# Patient Record
Sex: Male | Born: 1965 | Race: White | Hispanic: No | Marital: Married | Smoking: Never smoker
Health system: Southern US, Community
[De-identification: ages and names within clinical notes are randomized; demographics above are authoritative.]

## PROBLEM LIST (undated history)

## (undated) DIAGNOSIS — C9590 Leukemia, unspecified not having achieved remission: Secondary | ICD-10-CM

## (undated) DIAGNOSIS — C801 Malignant (primary) neoplasm, unspecified: Secondary | ICD-10-CM

## (undated) DIAGNOSIS — I639 Cerebral infarction, unspecified: Secondary | ICD-10-CM

## (undated) HISTORY — PX: APPENDECTOMY: SHX54

---

## 2019-04-01 DIAGNOSIS — Z1211 Encounter for screening for malignant neoplasm of colon: Secondary | ICD-10-CM | POA: Diagnosis not present

## 2019-04-01 DIAGNOSIS — F329 Major depressive disorder, single episode, unspecified: Secondary | ICD-10-CM | POA: Diagnosis not present

## 2019-04-01 DIAGNOSIS — K640 First degree hemorrhoids: Secondary | ICD-10-CM | POA: Diagnosis not present

## 2019-04-01 DIAGNOSIS — R42 Dizziness and giddiness: Secondary | ICD-10-CM | POA: Diagnosis not present

## 2019-05-06 DIAGNOSIS — R42 Dizziness and giddiness: Secondary | ICD-10-CM | POA: Diagnosis not present

## 2019-05-06 DIAGNOSIS — K640 First degree hemorrhoids: Secondary | ICD-10-CM | POA: Diagnosis not present

## 2019-05-06 DIAGNOSIS — F329 Major depressive disorder, single episode, unspecified: Secondary | ICD-10-CM | POA: Diagnosis not present

## 2019-07-20 DIAGNOSIS — M7710 Lateral epicondylitis, unspecified elbow: Secondary | ICD-10-CM | POA: Diagnosis not present

## 2019-07-20 DIAGNOSIS — K640 First degree hemorrhoids: Secondary | ICD-10-CM | POA: Diagnosis not present

## 2019-07-30 DIAGNOSIS — Z23 Encounter for immunization: Secondary | ICD-10-CM | POA: Diagnosis not present

## 2019-07-30 DIAGNOSIS — S61219A Laceration without foreign body of unspecified finger without damage to nail, initial encounter: Secondary | ICD-10-CM | POA: Diagnosis not present

## 2019-07-30 DIAGNOSIS — S60511A Abrasion of right hand, initial encounter: Secondary | ICD-10-CM | POA: Diagnosis not present

## 2019-10-20 DIAGNOSIS — Z1159 Encounter for screening for other viral diseases: Secondary | ICD-10-CM | POA: Diagnosis not present

## 2019-11-01 DIAGNOSIS — Z1211 Encounter for screening for malignant neoplasm of colon: Secondary | ICD-10-CM | POA: Diagnosis not present

## 2019-11-01 DIAGNOSIS — Z1212 Encounter for screening for malignant neoplasm of rectum: Secondary | ICD-10-CM | POA: Diagnosis not present

## 2019-11-01 DIAGNOSIS — L918 Other hypertrophic disorders of the skin: Secondary | ICD-10-CM | POA: Diagnosis not present

## 2019-11-01 DIAGNOSIS — K641 Second degree hemorrhoids: Secondary | ICD-10-CM | POA: Diagnosis not present

## 2019-11-03 ENCOUNTER — Emergency Department (HOSPITAL_COMMUNITY)
Admission: EM | Admit: 2019-11-03 | Discharge: 2019-11-03 | Disposition: A | Discharge status: Home / Self Care | Payer: BC Managed Care – PPO | Attending: Emergency Medicine | Admitting: Emergency Medicine

## 2019-11-03 ENCOUNTER — Encounter (HOSPITAL_COMMUNITY): Payer: Self-pay | Admitting: *Deleted

## 2019-11-03 ENCOUNTER — Other Ambulatory Visit: Payer: Self-pay

## 2019-11-03 DIAGNOSIS — H3589 Other specified retinal disorders: Secondary | ICD-10-CM | POA: Diagnosis not present

## 2019-11-03 DIAGNOSIS — R519 Headache, unspecified: Secondary | ICD-10-CM | POA: Diagnosis not present

## 2019-11-03 DIAGNOSIS — I10 Essential (primary) hypertension: Secondary | ICD-10-CM | POA: Diagnosis not present

## 2019-11-03 DIAGNOSIS — H539 Unspecified visual disturbance: Secondary | ICD-10-CM | POA: Insufficient documentation

## 2019-11-03 DIAGNOSIS — G453 Amaurosis fugax: Secondary | ICD-10-CM | POA: Diagnosis not present

## 2019-11-03 DIAGNOSIS — H35371 Puckering of macula, right eye: Secondary | ICD-10-CM | POA: Diagnosis not present

## 2019-11-03 DIAGNOSIS — H53411 Scotoma involving central area, right eye: Secondary | ICD-10-CM | POA: Diagnosis not present

## 2019-11-03 HISTORY — DX: Malignant (primary) neoplasm, unspecified: C80.1

## 2019-11-03 NOTE — ED Provider Notes (Signed)
[facility] DEPT Provider Note   CSN: [id number] Arrival date & time: 11/03/19  1140     History Chief Complaint  Patient presents with  . Hypertension  . Headache  . Memory Loss    Julus Kelley is a 54 y.o. 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  Patient states that he went to eye doctor, Dr. Posey Pronto [facility] this morning, was told to go to PCP for outpatient stroke work-up.  Patient went to PCP this morning who referred to ER for emergent stroke work-up.  Patient states that he started having vision loss in his right eye 3 weeks ago, lasted about 10 minutes.  This was his only episode of acute vision loss.  States for the next 5 days he had a sensation that there were "bright lights dropping" bilaterally that also lasted 10 minutes.  States that on the sixth day up until now he has not had any vision changes or problems.  Currently not complaining of any vision problems.  States that at around the same time he had a headache, states that it is frontal and mild.  Not the worst headache of his life. .  Not positional or exertional.  No photophobia, nausea, vomiting, fevers, chills, new neck pain, back pain.  No IV drug use or alcohol use.  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  Has been taking Motrin for his headache which has been helping.  Denies any numbness, tingling, weakness, facial droop, slurring, difficulty speaking during these admission symptoms or currently.  States that he generally healthy.  Has been eating and drinking normally.  Not on any blood thinners.  Also mentions that blood pressure has been raised, states that systolics have been in the 150s, this is new for him states that systolics are  normally in the 120s and is concerned about this. Does not take any medications daily. Marland Kitchen  HPI     Past Medical History:  Diagnosis Date  . Cancer ([place])     There are no problems to display for this patient.   Past Surgical History:  Procedure Laterality Date  . APPENDECTOMY         No family history on file.  Social History   Tobacco Use  . Smoking status: Never Smoker  . Smokeless tobacco: Never Used  Substance Use Topics  . Alcohol use: Yes    Comment: rarely  . Drug use: Never    Home Medications Prior to Admission medications   Not on File    Allergies    Patient has no known allergies.  Review of Systems   Review of Systems  Constitutional: Negative for chills, diaphoresis, fatigue and fever.  HENT: Negative for congestion, sore throat and trouble swallowing.   Eyes: Positive for visual disturbance. Negative for photophobia, pain, discharge, redness and itching.  Respiratory: Negative for cough, shortness of breath, wheezing and stridor.   Cardiovascular: Negative for chest pain, palpitations and leg swelling.  Gastrointestinal: Negative for abdominal distention, abdominal pain, diarrhea, nausea and vomiting.  Genitourinary: Negative for difficulty urinating.  Musculoskeletal: Negative for back pain, neck pain and neck stiffness.  Skin: Negative for pallor.  Neurological: Positive for headaches. Negative for dizziness, tremors, seizures, syncope, facial asymmetry,  speech difficulty, weakness, light-headedness and numbness.  Psychiatric/Behavioral: Negative for confusion.    Physical Exam Updated Vital Signs BP (!) 148/96 (BP Location: Left Arm)   Pulse (!) 53   Temp 98.6 F (37 C) (Oral)   Resp 16   Ht 5\' 10"  (1.778 m)   Wt 101.2 kg   SpO2 97%   BMI 32.00 kg/m   Physical Exam Constitutional:      General: He is not in acute distress.    Appearance: Normal appearance. He is not ill-appearing, toxic-appearing or diaphoretic.  HENT:      Mouth/Throat:     Mouth: Mucous membranes are moist.     Pharynx: Oropharynx is clear.  Eyes:     General: Lids are normal. Vision grossly intact. No visual field deficit or scleral icterus.       Right eye: No foreign body or discharge.        Left eye: No foreign body or discharge.     Extraocular Movements: Extraocular movements intact.     Right eye: Normal extraocular motion and no nystagmus.     Left eye: No nystagmus.     Pupils: Pupils are equal, round, and reactive to light.     Comments: Eyes are both dilated due to patient coming back from ophthalmologist, will reassess.  Cardiovascular:     Rate and Rhythm: Normal rate and regular rhythm.     Pulses: Normal pulses.     Heart sounds: Normal heart sounds.  Pulmonary:     Effort: Pulmonary effort is normal. No respiratory distress.     Breath sounds: Normal breath sounds. No stridor. No wheezing, rhonchi or rales.  Chest:     Chest wall: No tenderness.  Abdominal:     General: Abdomen is flat. There is no distension.     Palpations: Abdomen is soft.     Tenderness: There is no abdominal tenderness. There is no guarding or rebound.  Musculoskeletal:        General: No swelling or tenderness. Normal range of motion.     Cervical back: Normal range of motion and neck supple. No rigidity.     Right lower leg: No edema.     Left lower leg: No edema.  Skin:    General: Skin is warm and dry.     Capillary Refill: Capillary refill takes less than 2 seconds.     Coloration: Skin is not pale.  Neurological:     General: No focal deficit present.     Mental Status: He is alert and oriented to person, place, and time.     Comments: Alert and oriented times 3. Clear speech. No facial droop. CNIII-XII grossly intact. Bilateral upper and lower extremities' sensation grossly intact. 5/5 symmetric strength with grip strength and with plantar and dorsi flexion bilaterally.. Normal finger to nose bilaterally. Negative pronator drift.  Normal gait.    Psychiatric:        Mood and Affect: Mood normal.        Behavior: Behavior normal.     ED Results / Procedures / Treatments   Labs (all labs ordered are listed, but only abnormal results are displayed) Labs Reviewed - No data to display  EKG None  Radiology No results found.  Procedures Procedures (including critical care time)  Medications Ordered in ED Medications - No data to display  ED Course  I have reviewed the triage vital signs and the nursing notes.  Pertinent labs & imaging results that  were available during my care of the patient were reviewed by me and considered in my medical decision making (see chart for details).    MDM Rules/Calculators/A&P                         Mercedes Valeriano is a 54 y.o. 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  Spoke to Dr. Posey Pronto, [facility] who states that he did not need to come to the emergency department, wanted him to go to the primary care to get outpatient stroke work-up.  I think there was misunderstanding between PCP and emergency room work-up, therefore patient ended up in the ER.  After speaking to the patient about this he states that he does not want an acute stroke work-up due to billing, states that he has not had symptoms in over 2-1/2 weeks.  Dr. Posey Pronto was clear that he did not need to be in the ER, thought that he had an episode of amaurosis fugax, therefore needed full stroke work-up.  Was able to clear this up with patient, patient states that he wants to have this done via PCP and wants to leave, does not want any labs done here.  In regard to patient's headache, states that it is mild and he can handle this at home.  Patient has normal neuro exam.  Patient's blood pressure 148/96, no concerns for hypertensive urgency or emergency at this   time.  Will cancel orders and discharge patient due to patient's wishes.   Doubt need for further emergent work up at this time. I explained the diagnosis and have given explicit precautions to return to the ER including for any other new or worsening symptoms. The patient understands and accepts the medical plan as it's been dictated and I have answered their questions. Discharge instructions concerning home care and prescriptions have been given. The patient is STABLE and is discharged to home in good condition.   Final Clinical Impression(s) / ED Diagnoses Final diagnoses:  Mild headache    Rx / DC Orders ED Discharge Orders    None       Alfredia Client, PA-C 11/03/19 1530    Pattricia Boss, MD 11/05/19 1340

## 2019-11-03 NOTE — Discharge Instructions (Signed)
Please come back to the emerge department for any new worsening concerning symptoms such as acute visual loss, worsening headache.  Please call Dr. Posey Pronto with your PCP's information.

## 2019-11-03 NOTE — ED Triage Notes (Addendum)
Pt states his doctor sent him to r/o stroke. Symptoms of visual loss on and off started over 3 weeks ago. Continue to have HTN and headache, also states he can't remember things. Pt also states he thinks vision loss is related to his cancer.

## 2019-11-03 NOTE — ED Notes (Signed)
Delay in discharge due to patient speaking with [facility].

## 2019-11-09 DIAGNOSIS — I10 Essential (primary) hypertension: Secondary | ICD-10-CM | POA: Diagnosis not present

## 2019-11-10 ENCOUNTER — Other Ambulatory Visit: Payer: Self-pay

## 2019-11-10 ENCOUNTER — Observation Stay (HOSPITAL_COMMUNITY)
Admission: EM | Admit: 2019-11-10 | Discharge: 2019-11-11 | Disposition: A | Discharge status: Home / Self Care | Payer: BC Managed Care – PPO | Attending: Internal Medicine | Admitting: Internal Medicine

## 2019-11-10 ENCOUNTER — Emergency Department (HOSPITAL_COMMUNITY): Payer: BC Managed Care – PPO

## 2019-11-10 ENCOUNTER — Encounter (HOSPITAL_COMMUNITY): Payer: Self-pay | Admitting: *Deleted

## 2019-11-10 DIAGNOSIS — Z20822 Contact with and (suspected) exposure to covid-19: Secondary | ICD-10-CM | POA: Diagnosis not present

## 2019-11-10 DIAGNOSIS — I639 Cerebral infarction, unspecified: Principal | ICD-10-CM | POA: Diagnosis present

## 2019-11-10 DIAGNOSIS — R531 Weakness: Secondary | ICD-10-CM | POA: Diagnosis not present

## 2019-11-10 DIAGNOSIS — R410 Disorientation, unspecified: Secondary | ICD-10-CM | POA: Diagnosis not present

## 2019-11-10 DIAGNOSIS — R4781 Slurred speech: Secondary | ICD-10-CM

## 2019-11-10 DIAGNOSIS — Z859 Personal history of malignant neoplasm, unspecified: Secondary | ICD-10-CM | POA: Insufficient documentation

## 2019-11-10 DIAGNOSIS — R479 Unspecified speech disturbances: Secondary | ICD-10-CM | POA: Diagnosis not present

## 2019-11-10 DIAGNOSIS — R29818 Other symptoms and signs involving the nervous system: Secondary | ICD-10-CM | POA: Diagnosis not present

## 2019-11-10 HISTORY — DX: Cerebral infarction, unspecified: I63.9

## 2019-11-10 HISTORY — DX: Leukemia, unspecified not having achieved remission: C95.90

## 2019-11-10 LAB — APTT: aPTT: <20 seconds — ABNORMAL LOW (ref 24–36)

## 2019-11-10 LAB — CBC WITH DIFFERENTIAL/PLATELET
Abs Immature Granulocytes: 0.01 10*3/uL (ref 0.00–0.07)
Basophils Absolute: 0 10*3/uL (ref 0.0–0.1)
Basophils Relative: 1 %
Eosinophils Absolute: 0.1 10*3/uL (ref 0.0–0.5)
Eosinophils Relative: 2 %
HCT: 45.3 % (ref 39.0–52.0)
Hemoglobin: 15.1 g/dL (ref 13.0–17.0)
Immature Granulocytes: 0 %
Lymphocytes Relative: 23 %
Lymphs Abs: 1.2 10*3/uL (ref 0.7–4.0)
MCH: 31.1 pg (ref 26.0–34.0)
MCHC: 33.3 g/dL (ref 30.0–36.0)
MCV: 93.4 fL (ref 80.0–100.0)
Monocytes Absolute: 0.7 10*3/uL (ref 0.1–1.0)
Monocytes Relative: 13 %
Neutro Abs: 3.2 10*3/uL (ref 1.7–7.7)
Neutrophils Relative %: 61 %
Platelets: 214 10*3/uL (ref 150–400)
RBC: 4.85 MIL/uL (ref 4.22–5.81)
RDW: 13.2 % (ref 11.5–15.5)
WBC: 5.3 10*3/uL (ref 4.0–10.5)
nRBC: 0 % (ref 0.0–0.2)

## 2019-11-10 LAB — COMPREHENSIVE METABOLIC PANEL
ALT: 36 U/L (ref 0–44)
AST: 26 U/L (ref 15–41)
Albumin: 4.3 g/dL (ref 3.5–5.0)
Alkaline Phosphatase: 70 U/L (ref 38–126)
Anion gap: 10 (ref 5–15)
BUN: 14 mg/dL (ref 6–20)
CO2: 24 mmol/L (ref 22–32)
Calcium: 9.3 mg/dL (ref 8.9–10.3)
Chloride: 99 mmol/L (ref 98–111)
Creatinine, Ser: 0.82 mg/dL (ref 0.61–1.24)
GFR calc non Af Amer: >60 mL/min (ref >60)
Glucose, Bld: 98 mg/dL (ref 70–99)
Potassium: 4 mmol/L (ref 3.5–5.1)
Sodium: 133 mmol/L — ABNORMAL LOW (ref 135–145)
Total Bilirubin: 1 mg/dL (ref 0.3–1.2)
Total Protein: 7.6 g/dL (ref 6.5–8.1)

## 2019-11-10 LAB — I-STAT CHEM 8, ED
BUN: 15 mg/dL (ref 6–20)
Calcium, Ion: 1.07 mmol/L — ABNORMAL LOW (ref 1.15–1.40)
Chloride: 101 mmol/L (ref 98–111)
Creatinine, Ser: 0.8 mg/dL (ref 0.61–1.24)
Glucose, Bld: 98 mg/dL (ref 70–99)
HCT: 47 % (ref 39.0–52.0)
Hemoglobin: 16 g/dL (ref 13.0–17.0)
Potassium: 4.3 mmol/L (ref 3.5–5.1)
Sodium: 136 mmol/L (ref 135–145)
TCO2: 25 mmol/L (ref 22–32)

## 2019-11-10 LAB — ETHANOL: Alcohol, Ethyl (B): <10 mg/dL (ref <10)

## 2019-11-10 LAB — RESPIRATORY PANEL BY RT PCR (FLU A&B, COVID)
Influenza A by PCR: NEGATIVE
Influenza B by PCR: NEGATIVE
SARS Coronavirus 2 by RT PCR: NEGATIVE

## 2019-11-10 LAB — CBG MONITORING, ED: Glucose-Capillary: 99 mg/dL (ref 70–99)

## 2019-11-10 LAB — PROTIME-INR
INR: 0.9 (ref 0.8–1.2)
Prothrombin Time: 12 seconds (ref 11.4–15.2)

## 2019-11-10 MED ORDER — ALTEPLASE 100 MG IV SOLR
INTRAVENOUS | Status: AC
  Filled 2019-11-10: qty 100

## 2019-11-10 MED ORDER — IOHEXOL 350 MG/ML SOLN
100.0000 mL | Freq: Once | INTRAVENOUS | Status: AC | PRN
  Administered 2019-11-10: 75 mL via INTRAVENOUS

## 2019-11-10 NOTE — ED Triage Notes (Signed)
Pt brought in by a friend from home with c/o new onset confusion, slurred speech and difficulty forming thoughts that started at 1315 today. Pt's friend was going to take him to a doctor's appt in [place] but he instantly started saying that he didn't feel well and needed to go to the hospital. Pt c/o nausea, headache and generalized weakness. Strength equal bilaterally. No facial droop.

## 2019-11-10 NOTE — H&P (Signed)
History and Physical  Eamon Tantillo GYI:[id number] DOB: 1965-05-31 DOA: 11/10/2019  Referring physician: Aundria Rud, MD PCP: [place] @ [facility]  Patient coming from: Home   Chief Complaint: Altered mental status  HPI: Steven Conway is a 54 y.o. 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  Patient states that symptoms started about a month ago with complaints of intermittent vision loss (about 5 episodes within last month with each episode lasting about 10 minutes).  He saw an outpatient ophthalmology (Dr. Posey Pronto [facility] specialist) and was referred to have an outpatient further stroke work-up.  He was seen in the ED on 9/29 due to hypertension, headache and visual loss.  At that time, patient did not want an acute stroke work-up (due to medical cost and especially since he was referred to an outpatient PCP for further stroke work-up and since he was thought to have an episode of amaurosis fugax by his ophthalmologist), patient also had normal neuro exam at that time and BP was 140/96 with no concern for hypertensive urgency or emergency at that time, so he was discharged home to follow-up with his PCP per ED medical record. Patient was going to follow-up with his PCP at [facility] today, but he noted another episode of vision loss, he checked his BP and it was 190/105, on his way to his PCPs appointment, his friend who was taking him to the appointment noted him having slurred speech and confusion and it was decided for him to go to the ED rather than going to his PCP. He denies chest pain, shortness of breath, nausea, vomiting or abdominal pain.   ED Course:  In the emergency department, BP on arrival was 151/104, HR was 58 bpm, but other vital signs were within normal range.  Work-up in the ED showed normal CBC and  mild hyponatremia.  CT head without contrast showed no acute retinal hemorrhage or evidence of acute infection.  CT angiography head and neck showed normal CT angiography of the head and neck with no large or medium vessel occlusion.  Teleneurologist was consulted and MRI brain with and without contrast was recommended.  Hospitalist was asked to admit for further evaluation and management.  Review of Systems: Constitutional: Negative for chills and fever.  HENT: Negative for ear pain and sore throat.   Eyes: Negative for pain and visual disturbance.  Respiratory: Negative for cough, chest tightness and shortness of breath.   Cardiovascular: Negative for chest pain and palpitations.  Gastrointestinal: Negative for abdominal pain and vomiting.  Endocrine: Negative for polyphagia and polyuria.  Genitourinary: Negative for decreased urine volume, dysuria, enuresis Musculoskeletal: Negative for arthralgias and back pain.  Skin: Negative for color change and rash.  Allergic/Immunologic: Negative for immunocompromised state.  Neurological: Negative for tremors, syncope, speech difficulty, weakness, light-headedness and headaches.  Hematological: Does not bruise/bleed easily.  All other systems reviewed and are negative    Past Medical History:  Diagnosis Date  . Cancer ([place])   . Leukemia ([place])   . Stroke [facility])    Past Surgical History:  Procedure Laterality Date  . APPENDECTOMY      Social History:  reports that he has never smoked. He has never used smokeless tobacco. He reports current alcohol use. He reports that he does not use drugs.   No Known Allergies  No family history on file.  Prior to Admission medications   Medication Sig Start Date End Date Taking? Authorizing Provider  buPROPion [facility] SR) 100 MG 12 hr tablet Take 100 mg by mouth every morning. 11/01/19  Yes [provider]  chlorthalidone (HYGROTON) 25 MG tablet Take 25 mg by mouth daily. 11/08/19  Yes  [provider]  citalopram (CELEXA) 40 MG tablet Take 40 mg by mouth daily.   Yes [provider]  finasteride (PROPECIA) 1 MG tablet Take 1 mg by mouth daily. 09/23/19  Yes [provider]  lisinopril (ZESTRIL) 10 MG tablet Take 10 mg by mouth daily.   Yes [provider]    Physical Exam: BP 135/90   Pulse (!) 56   Resp 19   Ht 5\' 10"  (1.778 m)   Wt 101.1 kg   SpO2 96%   BMI 32.00 kg/m   . General: 54 y.o. year-old male well developed well nourished in no acute distress.  Alert and oriented x3. Marland Kitchen HEENT: NCAT, EOMI . Neck: Supple, trachea medial . Cardiovascular: Regular rate and rhythm with no rubs or gallops.  No thyromegaly or JVD noted.  No lower extremity edema. 2/4 pulses in all 4 extremities. Marland Kitchen Respiratory: Clear to auscultation with no wheezes or rales. Good inspiratory effort. . Abdomen: Soft nontender nondistended with normal bowel sounds x4 quadrants. . Muskuloskeletal: No cyanosis, clubbing or edema noted bilaterally . Neuro: CN II-XII intact, strength, sensation, reflexes, NIHSS 0 . Skin: No ulcerative lesions noted or rashes . Psychiatry: Judgement and insight appear normal. Mood is appropriate for condition and setting          Labs on Admission:  Basic Metabolic Panel: Recent Labs  Lab 11/10/19 1420 11/10/19 1427  NA 133* 136  K 4.0 4.3  CL 99 101  CO2 24  --   GLUCOSE 98 98  BUN 14 15  CREATININE 0.82 0.80  CALCIUM 9.3  --    Liver Function Tests: Recent Labs  Lab 11/10/19 1420  AST 26  ALT 36  ALKPHOS 70  BILITOT 1.0  PROT 7.6  ALBUMIN 4.3   No results for input(s): LIPASE, AMYLASE in the last 168 hours. No results for input(s): AMMONIA in the last 168 hours. CBC: Recent Labs  Lab 11/10/19 1427 11/10/19 1541  WBC  --  5.3  NEUTROABS  --  3.2  HGB 16.0 15.1  HCT 47.0 45.3  MCV  --  93.4  PLT  --  214   Cardiac Enzymes: No results for input(s): CKTOTAL, CKMB, CKMBINDEX, TROPONINI in the last 168  hours.  BNP (last 3 results) No results for input(s): BNP in the last 8760 hours.  ProBNP (last 3 results) No results for input(s): PROBNP in the last 8760 hours.  CBG: Recent Labs  Lab 11/10/19 1411  GLUCAP 99    Radiological Exams on Admission: CT Code Stroke CTA Head W/WO contrast  Result Date: 11/10/2019 CLINICAL DATA:  Code stroke.  Confusion.  Speech disturbance. EXAM: CT ANGIOGRAPHY HEAD AND NECK TECHNIQUE: Multidetector CT imaging of the head and neck was performed using the standard protocol during bolus administration of intravenous contrast. Multiplanar CT image reconstructions and MIPs were obtained to evaluate the vascular anatomy. Carotid stenosis measurements (when applicable) are obtained utilizing NASCET criteria, using the distal internal carotid diameter as the denominator. CONTRAST:  26mL OMNIPAQUE IOHEXOL 350 MG/ML SOLN COMPARISON:  Head CT earlier same day. FINDINGS: CTA NECK FINDINGS Aortic arch: Normal. No atherosclerotic disease. Branching pattern is normal. Right carotid  system: Common carotid artery widely patent to the bifurcation. Carotid bifurcation is normal without soft or calcified plaque. Cervical ICA is normal. Left carotid system: Common carotid artery widely patent to bifurcation. Bifurcation is normal without soft or calcified plaque. Cervical ICA widely patent. Vertebral arteries: Both vertebral artery origins are widely patent. Both vertebral arteries are normal through the cervical region to the foramen magnum. Skeleton: Ordinary spondylosis C5-6. Other neck: No mass or lymphadenopathy. Upper chest: Normal Review of the MIP images confirms the above findings CTA HEAD FINDINGS Anterior circulation: Both internal carotid arteries are widely patent through the skull base and siphon regions. No siphon stenosis. The anterior and middle cerebral vessels are normal without large or medium vessel occlusion or proximal stenosis. Posterior circulation: Both vertebral  arteries widely patent to the basilar. No basilar stenosis. Posterior circulation branch vessels are. Venous sinuses: Patent normal. Anatomic variants: None significant. Review of the MIP images confirms the above findings IMPRESSION: Normal CT angiography of the head and neck. No atherosclerotic disease. No large or medium vessel occlusion. Electronically Signed   By: Nelson Chimes M.D.   On: 11/10/2019 15:32   CT Code Stroke CTA Neck W/WO contrast  Result Date: 11/10/2019 CLINICAL DATA:  Code stroke.  Confusion.  Speech disturbance. EXAM: CT ANGIOGRAPHY HEAD AND NECK TECHNIQUE: Multidetector CT imaging of the head and neck was performed using the standard protocol during bolus administration of intravenous contrast. Multiplanar CT image reconstructions and MIPs were obtained to evaluate the vascular anatomy. Carotid stenosis measurements (when applicable) are obtained utilizing NASCET criteria, using the distal internal carotid diameter as the denominator. CONTRAST:  43mL OMNIPAQUE IOHEXOL 350 MG/ML SOLN COMPARISON:  Head CT earlier same day. FINDINGS: CTA NECK FINDINGS Aortic arch: Normal. No atherosclerotic disease. Branching pattern is normal. Right carotid system: Common carotid artery widely patent to the bifurcation. Carotid bifurcation is normal without soft or calcified plaque. Cervical ICA is normal. Left carotid system: Common carotid artery widely patent to bifurcation. Bifurcation is normal without soft or calcified plaque. Cervical ICA widely patent. Vertebral arteries: Both vertebral artery origins are widely patent. Both vertebral arteries are normal through the cervical region to the foramen magnum. Skeleton: Ordinary spondylosis C5-6. Other neck: No mass or lymphadenopathy. Upper chest: Normal Review of the MIP images confirms the above findings CTA HEAD FINDINGS Anterior circulation: Both internal carotid arteries are widely patent through the skull base and siphon regions. No siphon stenosis.  The anterior and middle cerebral vessels are normal without large or medium vessel occlusion or proximal stenosis. Posterior circulation: Both vertebral arteries widely patent to the basilar. No basilar stenosis. Posterior circulation branch vessels are. Venous sinuses: Patent normal. Anatomic variants: None significant. Review of the MIP images confirms the above findings IMPRESSION: Normal CT angiography of the head and neck. No atherosclerotic disease. No large or medium vessel occlusion. Electronically Signed   By: Nelson Chimes M.D.   On: 11/10/2019 15:32   CT HEAD CODE STROKE WO CONTRAST  Result Date: 11/10/2019 CLINICAL DATA:  Code stroke.  Confusion, abnormal speech EXAM: CT HEAD WITHOUT CONTRAST TECHNIQUE: Contiguous axial images were obtained from the base of the skull through the vertex without intravenous contrast. COMPARISON:  None. FINDINGS: Brain: There is no acute intracranial hemorrhage, mass effect, or edema. Gray-white differentiation is preserved. Ventricles and sulci are normal in size and configuration. There is no extra-axial fluid collection. Vascular: No hyperdense vessel. Skull: Unremarkable Sinuses/Orbits: No acute abnormality. Other: Mastoid air cells are clear. ASPECTS [facility] Stroke  Program Early CT Score) - Ganglionic level infarction (caudate, lentiform nuclei, internal capsule, insula, M1-M3 cortex): 7 - Supraganglionic infarction (M4-M6 cortex): 3 Total score (0-10 with 10 being normal): 10 IMPRESSION: No acute intracranial hemorrhage or evidence of acute infarction. ASPECT score is 10. These results were called by telephone at the time of interpretation on 11/10/2019 at 2:14 pm to provider [facility] , who verbally acknowledged these results. Electronically Signed   By: Macy Mis M.D.   On: 11/10/2019 14:16    EKG: I independently viewed the EKG done and my findings are as followed: Sinus bradycardia at rate of 56 bpm  Assessment/Plan Present on Admission: .  Acute CVA (cerebrovascular accident) [facility])  Active Problems:   Acute CVA (cerebrovascular accident) ([place])  Confusion and slurred speech R/O acute CVA Patient presented with slurred speech and transitory confusion CT of head, CT angiography of head and neck showed no acute findings Patient will be admitted to telemetry unit  Bilateral carotid ultrasound in the morning Echocardiogram in the morning MRI of brain without contrast in the morning Continue fall precautions and neuro checks Lipid panel and hemoglobin A1c will be checked Continue PT/OT eval and treat Bedside swallow eval by nursing prior to diet Tele neurology was consulted in the ED and recommended further stroke work-up Neurologist will be consulted in the morning and we shall await further recommendations.   DVT prophylaxis: SCDs (no indication for prophylaxis at this time due to possible acute ischemic stroke)  Code Status: Full code  Family Communication: None at bedside  Disposition Plan:  Patient is from:                        home Anticipated DC to:                   home Anticipated DC date:               1 day Anticipated DC barriers:          Patient unstable for discharge at this time due to suspicion for acute ischemic stroke which requires further stroke work-up in the morning.   Consults called: Teleneurology (by ED team), Neurology  Admission status: Observation    Bernadette Hoit MD [facility]  If 7PM-7AM, please contact night-coverage [email or web address] Password [email or web address]  11/10/2019, 9:28 PM

## 2019-11-10 NOTE — ED Provider Notes (Signed)
Teleneurology is recommending MRI in the morning of admission.  We will make arrangements for that.   Fredia Sorrow, MD 11/10/19 [phone]

## 2019-11-10 NOTE — Consult Note (Addendum)
[facility] Telemedicine Consult  Requesting Provider: Dr. Fredia Sorrow  Chief Complaint: Altered mental status  HPI: [name] is a 54 year old man with a past medical history significant for leukemia (2019, status post chemotherapy).  He began having episodes of hypotension associated with headache and neck stiffness as well as vision loss.  His first symptom was 3 to 4 days of intermittent vision loss for which he saw outpatient ophthalmology.  Retina specialist referred him to have further stroke work-up, for which there was a mixup and he presented to the ED instead of his PCP.  At the time of his presentation to the ED on 9/29 he was minimally symptomatic and was therefore discharged with plans for outpatient follow-up.  He was picked up by a friend to go to a doctor's appointment today, but was brought to the emergency department because he was having nausea, confusion, and slurred speech.  His blood pressure on arrival was in the 150s, though he reports BP of 220s at home, and his symptoms resolved over the course of his ED stay as his blood pressure normalized without any intervention.  Regarding his headaches, he reports they are in the back of his head and denies that they are waking him up at night, does not feel that they are positional, does not have any associated pulsatile tinnitus in his ears although he does have intermittent light ringing in his ears. He does have associated neck stiffness only during his headache episodes but not constantly.  He does have some intermittent nausea.  He reports that he did have another episode of vision loss described as "black spots in my vision" with today's episode of hypertension.  He has also been having feeling of vertigo with head movement.  He denies any fever, chills, changes in his weight, rashes, urinary symptoms, bowel symptoms.  He reports the headache is overall mild about 4 out of 10 in intensity LKW: 11:15  AM tpa given?: No, no focal deficits concerning for stroke IR Thrombectomy? No, no LVO Modified Rankin Scale: 0-Completely asymptomatic   Exam: Vitals:   11/10/19 1845 11/10/19 1900  BP: (!) 135/94 135/90  Pulse: (!) 55 (!) 56  Resp: (!) 21 19  SpO2: 98% 96%    General: On initial evaluation he was sleepy and slow to respond, but greatly improved once his blood pressure normalized  1A: Level of Consciousness - 1 1B: Ask Month and Age - 0 1C: 'Blink Eyes' & 'Squeeze Hands' - 0 2: Test Horizontal Extraocular Movements - 0 3: Test Visual Fields - 0 4: Test Facial Palsy - 0 5A: Test Left Arm Motor Drift - 0 5B: Test Right Arm Motor Drift - 0 6A: Test Left Leg Motor Drift - 0 6B: Test Right Leg Motor Drift - 0 7: Test Limb Ataxia - 0 8: Test Sensation - 0 9: Test Language/Aphasia- 0 10: Test Dysarthria - 0 11: Test Extinction/Inattention - 0 NIHSS score: 1  Imaging Reviewed:  Dry head CT without any acute intracranial process CTA without any vascular beading suggestive of RCVS or large vessel occlusion Agree with radiology that venous contrast suggests no evidence of venous thrombosis either  Labs reviewed in epic and pertinent values follow: Glucose of 99, creatinine of 0.8, normal CBC,   Assessment: This is a 54 year old gentleman with a history of AML presenting with altered mental status in the setting of hypertension.  Given the resolution of his symptoms with blood pressure normalization, hypertensive encephalopathy seems likely.  However the etiology of his intermittent hypotension is unclear and needs to be elucidated given his very significant symptoms and the dangerously high blood pressures he has been intermittently experiencing.  Given his headache and cancer history, will also obtain an MRI brain with and without contrast to evaluate for intracranial processes.  RCVS is a possibility but seems less likely given that he does not report headache as a major symptom  during the event (reports it is just a 4 out of 10 in intensity and there but not very severe); additionally the CT angiography obtained during his acute episode was not supportive of this diagnosis.  Recommendations:  -CTA was obtained on my recommendations as above -MRI brain with and without contrast -Follow-up urinalysis and urine toxicology -Recommend admission for work-up of causes of hypertensive emergency   Consult Participants: Patient, ED Nurse, [facility] nurse Location of the provider: [facility]  Location of the patient: [facility]   This consult was provided via telemedicine with 2-way video and audio communication. The patient/family was informed that care would be provided in this way and agreed to receive care in this manner.  There was initially a technical issue with my video not working, which was resolved by restarting the computer.  I was able to connect with the patient later in the day.  This patient is receiving care for possible acute neurological changes. There was 50 minutes of care by this provider at the time of service, including time for direct evaluation via telemedicine, review of medical records, imaging studies and discussion of findings with providers, the patient and/or family.  Lesleigh Noe MD-PhD [facility] [phone]    If 8pm- 8am, please page neurology on call as listed in [facility].

## 2019-11-10 NOTE — ED Provider Notes (Signed)
[facility] EMERGENCY DEPARTMENT Provider Note  CSN: [id number] Arrival date & time: 11/10/19 1347    History Chief Complaint  Patient presents with  . Weakness    HPI  Steven Conway is a 54 y.o. male with remote history of AML brought to the ED by a friend who was taking him to a regularly scheduled PCP appointment in [place] when he began to have what she describes as confusion and slurred speech.   Interestingly he has had several episodes of acute vision loss over the last few weeks that typically goes away after a few minutes, he was seen on Sep 29 at Ophthalmology who was concerned about a possible amaurosis fugax and recommended an outpatient stroke eval at PCP, the patient was re-directed to ED by his PCP office that day but ultimately decided against an ED workup and requested discharge prior to any ED workup being done. He has not been feeling well lately, was seen by some doctor earlier this week that he cannot remember and told his BP was high, started on a new BP med which he also cannot remember. He is complaining of headache today as well.    Past Medical History:  Diagnosis Date  . Cancer ([place])   . Leukemia ([place])   . Stroke [facility])     Past Surgical History:  Procedure Laterality Date  . APPENDECTOMY      No family history on file.  Social History   Tobacco Use  . Smoking status: Never Smoker  . Smokeless tobacco: Never Used  Vaping Use  . Vaping Use: Never used  Substance Use Topics  . Alcohol use: Yes    Comment: rarely  . Drug use: Never     Home Medications Prior to Admission medications   Not on File     Allergies    Patient has no known allergies.   Review of Systems   Review of Systems A comprehensive review of systems was completed and negative except as noted in HPI.    Physical Exam BP (!) 163/96   Pulse (!) 57   Resp 16   Ht 5\' 10"  (1.778 m)   Wt 101.1 kg   SpO2 95%   BMI 32.00 kg/m   Physical Exam Vitals and  nursing note reviewed.  Constitutional:      Appearance: Normal appearance.  HENT:     Head: Normocephalic and atraumatic.     Nose: Nose normal.     Mouth/Throat:     Mouth: Mucous membranes are moist.  Eyes:     Extraocular Movements: Extraocular movements intact.     Conjunctiva/sclera: Conjunctivae normal.  Cardiovascular:     Rate and Rhythm: Normal rate.  Pulmonary:     Effort: Pulmonary effort is normal.     Breath sounds: Normal breath sounds.  Abdominal:     General: Abdomen is flat.     Palpations: Abdomen is soft.     Tenderness: There is no abdominal tenderness.  Musculoskeletal:        General: No swelling. Normal range of motion.     Cervical back: Neck supple.  Skin:    General: Skin is warm and dry.  Neurological:     General: No focal deficit present.     Mental Status: He is alert and oriented to person, place, and time.     Cranial Nerves: No cranial nerve deficit.     Sensory: No sensory deficit.     Motor: No weakness.  Coordination: Coordination normal.  Psychiatric:        Mood and Affect: Mood normal.      ED Results / Procedures / Treatments   Labs (all labs ordered are listed, but only abnormal results are displayed) Labs Reviewed  COMPREHENSIVE METABOLIC PANEL - Abnormal; Notable for the following components:      Result Value   Sodium 133 (*)    All other components within normal limits  I-STAT CHEM 8, ED - Abnormal; Notable for the following components:   Calcium, Ion 1.07 (*)    All other components within normal limits  ETHANOL  PROTIME-INR  APTT  RAPID URINE DRUG SCREEN, HOSP PERFORMED  URINALYSIS, ROUTINE W REFLEX MICROSCOPIC  CBC WITH DIFFERENTIAL/PLATELET  CBG MONITORING, ED    EKG EKG Interpretation  Date/Time:  Wednesday November 10 2019 14:32:29 EDT Ventricular Rate:  56 PR Interval:    QRS Duration: 102 QT Interval:  462 QTC Calculation: 446 R Axis:   6 Text Interpretation: Sinus rhythm Abnormal R-wave  progression, early transition No old tracing to compare Confirmed by Calvert Cantor [phone]) on 11/10/2019 2:48:48 PM   Radiology CT HEAD CODE STROKE WO CONTRAST  Result Date: 11/10/2019 CLINICAL DATA:  Code stroke.  Confusion, abnormal speech EXAM: CT HEAD WITHOUT CONTRAST TECHNIQUE: Contiguous axial images were obtained from the base of the skull through the vertex without intravenous contrast. COMPARISON:  None. FINDINGS: Brain: There is no acute intracranial hemorrhage, mass effect, or edema. Gray-white differentiation is preserved. Ventricles and sulci are normal in size and configuration. There is no extra-axial fluid collection. Vascular: No hyperdense vessel. Skull: Unremarkable Sinuses/Orbits: No acute abnormality. Other: Mastoid air cells are clear. ASPECTS ([place] Stroke Program Early CT Score) - Ganglionic level infarction (caudate, lentiform nuclei, internal capsule, insula, M1-M3 cortex): 7 - Supraganglionic infarction (M4-M6 cortex): 3 Total score (0-10 with 10 being normal): 10 IMPRESSION: No acute intracranial hemorrhage or evidence of acute infarction. ASPECT score is 10. These results were called by telephone at the time of interpretation on 11/10/2019 at 2:14 pm to provider [facility] , who verbally acknowledged these results. Electronically Signed   By: Macy Mis M.D.   On: 11/10/2019 14:16    Procedures Procedures  Medications Ordered in the ED Medications  iohexol (OMNIPAQUE) 350 MG/ML injection 100 mL (75 mLs Intravenous Contrast Given 11/10/19 1445)     MDM Rules/Calculators/A&P MDM Patient with no focal deficits now, but does have some slowed speech, unclear of baseline. Given reportedly new neuro symptoms in tPA window, will activate Code Stroke.  ED Course  I have reviewed the triage vital signs and the nursing notes.  Pertinent labs & imaging results that were available during my care of the patient were reviewed by me and considered in my medical  decision making (see chart for details).  Clinical Course as of Nov 09 1498  Wed Nov 10, 2019  1418 CT is neg for acute process.    [CS]  1440 I-stat chem is normal   [CS]  2353 CMP is neg.   [CS]  6144 Patient seen by Teleneurology and CTA has been ordered. Care of the patient will be signed out to the oncoming shift pending those results and ultimate disposition.    [CS]    Clinical Course User Index [CS] Truddie Hidden, MD    Final Clinical Impression(s) / ED Diagnoses Final diagnoses:  None    Rx / DC Orders ED Discharge Orders    None  Truddie Hidden, MD 11/10/19 1500

## 2019-11-11 ENCOUNTER — Observation Stay (HOSPITAL_BASED_OUTPATIENT_CLINIC_OR_DEPARTMENT_OTHER): Payer: BC Managed Care – PPO

## 2019-11-11 ENCOUNTER — Observation Stay (HOSPITAL_COMMUNITY): Payer: BC Managed Care – PPO

## 2019-11-11 DIAGNOSIS — R531 Weakness: Secondary | ICD-10-CM | POA: Diagnosis not present

## 2019-11-11 DIAGNOSIS — I639 Cerebral infarction, unspecified: Secondary | ICD-10-CM | POA: Diagnosis not present

## 2019-11-11 DIAGNOSIS — R9082 White matter disease, unspecified: Secondary | ICD-10-CM | POA: Diagnosis not present

## 2019-11-11 DIAGNOSIS — I6389 Other cerebral infarction: Secondary | ICD-10-CM

## 2019-11-11 DIAGNOSIS — R42 Dizziness and giddiness: Secondary | ICD-10-CM | POA: Diagnosis not present

## 2019-11-11 DIAGNOSIS — R410 Disorientation, unspecified: Secondary | ICD-10-CM

## 2019-11-11 DIAGNOSIS — I6782 Cerebral ischemia: Secondary | ICD-10-CM | POA: Diagnosis not present

## 2019-11-11 DIAGNOSIS — R4781 Slurred speech: Secondary | ICD-10-CM

## 2019-11-11 LAB — ECHOCARDIOGRAM COMPLETE
AR max vel: 2.52 cm2
AV Area VTI: 2.39 cm2
AV Area mean vel: 2.21 cm2
AV Mean grad: 2.7 mmHg
AV Peak grad: 5.3 mmHg
Ao pk vel: 1.16 m/s
Area-P 1/2: 2.07 cm2
Height: 70 in
S' Lateral: 2.38 cm
Weight: 3567.92 oz

## 2019-11-11 LAB — LIPID PANEL
Cholesterol: 196 mg/dL (ref 0–200)
HDL: 40 mg/dL — ABNORMAL LOW (ref >40)
LDL Cholesterol: 127 mg/dL — ABNORMAL HIGH (ref 0–99)
Total CHOL/HDL Ratio: 4.9 RATIO
Triglycerides: 146 mg/dL (ref <150)
VLDL: 29 mg/dL (ref 0–40)

## 2019-11-11 LAB — HEMOGLOBIN A1C
Hgb A1c MFr Bld: 6 % — ABNORMAL HIGH (ref 4.8–5.6)
Mean Plasma Glucose: 125.5 mg/dL

## 2019-11-11 LAB — CBC
HCT: 48.5 % (ref 39.0–52.0)
Hemoglobin: 16.1 g/dL (ref 13.0–17.0)
MCH: 31.1 pg (ref 26.0–34.0)
MCHC: 33.2 g/dL (ref 30.0–36.0)
MCV: 93.6 fL (ref 80.0–100.0)
Platelets: 221 10*3/uL (ref 150–400)
RBC: 5.18 MIL/uL (ref 4.22–5.81)
RDW: 13.2 % (ref 11.5–15.5)
WBC: 6.8 10*3/uL (ref 4.0–10.5)
nRBC: 0 % (ref 0.0–0.2)

## 2019-11-11 LAB — APTT: aPTT: 24 seconds (ref 24–36)

## 2019-11-11 LAB — HIV ANTIBODY (ROUTINE TESTING W REFLEX): HIV Screen 4th Generation wRfx: NONREACTIVE

## 2019-11-11 LAB — PROTIME-INR
INR: 1 (ref 0.8–1.2)
Prothrombin Time: 12.3 seconds (ref 11.4–15.2)

## 2019-11-11 MED ORDER — ASPIRIN 325 MG PO TABS
325.0000 mg | ORAL_TABLET | Freq: Every day | ORAL | Status: DC
  Administered 2019-11-11: 325 mg via ORAL
  Filled 2019-11-11: qty 1

## 2019-11-11 MED ORDER — GADOBUTROL 1 MMOL/ML IV SOLN
10.0000 mL | Freq: Once | INTRAVENOUS | Status: AC | PRN
  Administered 2019-11-11: 10 mL via INTRAVENOUS

## 2019-11-11 NOTE — Discharge Summary (Signed)
Physician Discharge Summary  Steven Conway OAC:[id number] DOB: 05/02/1965 DOA: 11/10/2019  PCP: [facility] [place]  Admit date: 11/10/2019  Discharge date: 11/11/2019  Admitted From:Home  Disposition:  Home  Recommendations for Outpatient Follow-up:  1. Follow up with PCP in 1-2 weeks 2. Follow-up with Dr. Merlene Laughter in outpatient setting with information provided in the next 4 weeks 3. Continue on blood pressure medications at home as prescribed below and monitor blood pressure in office with follow-up at PCP office in the next 1-2 weeks.  Home Health: None  Equipment/Devices: None  Discharge Condition: Stable  CODE STATUS: Full  Diet recommendation: Heart Healthy  Brief/Interim Summary: Per HPI: Steven Conway is a 54 y.o. 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  Patient states that symptoms started about a month ago with complaints of intermittent vision loss (about 5 episodes within last month with each episode lasting about 10 minutes).  He saw an outpatient ophthalmology (Dr. Posey [facility]- retinal specialist) and was referred to have an outpatient further stroke work-up.  He was seen in the ED on 9/29 due to hypertension, headache and visual loss.  At that time, patient did not want an acute stroke work-up (due to medical cost and especially since he was referred to an outpatient PCP for further stroke work-up and since he was thought to have an episode of amaurosis fugax by his ophthalmologist), patient also had normal neuro exam at that time and BP was 140/96 with no concern for hypertensive urgency or emergency at that time, so he was discharged home to follow-up with his PCP per ED medical record. Patient was going to follow-up with his PCP at [facility] today, but he noted another episode of vision loss, he  checked his BP and it was 190/105, on his way to his PCPs appointment, his friend who was taking him to the appointment noted him having slurred speech and confusion and it was decided for him to go to the ED rather than going to his PCP. He denies chest pain, shortness of breath, nausea, vomiting or abdominal pain.   10/7: Patient was admitted with confusion and slurred speech with possibility of CVA which has now been ruled out with no findings on brain MRI noted.  He has no further symptoms of confusion or slurred speech and states that he is back to his usual baseline.  He has undergone PT/OT evaluation with no need for rehabilitation or other recommendations noted.  He has undergone 2D echocardiogram with LVEF 60-65% with no other acute findings and carotid ultrasound with no acute findings.  We had discussed the possible need for statin use in the near future, but he states that he has been eating fast food extensively recently and will work on his diet and would rather avoid medication.  His LDL is 127 and hemoglobin A1c is 6%.  He agrees to follow-up with neurology in the near future for consideration of possible RCVS or other etiology which may be causing his symptoms.  He is also worried about the fact that he may have some anxiety which may be causing the vast majority of his symptoms with significant blood pressure elevations as well as his neurological issues.   Discharge Diagnoses:  Principal Problem:   Acute CVA (cerebrovascular accident) [facility]) Active Problems:   Slurred speech   Confusion   CVA (cerebral  vascular accident) [facility])  Principal discharge diagnosis: Transient dysarthria and confusion possibly related to TIA versus other etiology such as anxiety.  Discharge Instructions  Discharge Instructions    Diet - low sodium heart healthy   Complete by: As directed    Increase activity slowly   Complete by: As directed      Allergies as of 11/11/2019   No Known Allergies      Medication List    TAKE these medications   buPROPion 100 MG 12 hr tablet Commonly known as: WELLBUTRIN SR Take 100 mg by mouth every morning.   chlorthalidone 25 MG tablet Commonly known as: HYGROTON Take 25 mg by mouth daily.   citalopram 40 MG tablet Commonly known as: CELEXA Take 40 mg by mouth daily.   finasteride 1 MG tablet Commonly known as: PROPECIA Take 1 mg by mouth daily.   lisinopril 10 MG tablet Commonly known as: ZESTRIL Take 10 mg by mouth daily.       Follow-up Information    [facility] [place] Follow up in 1 week(s).   Specialty: Family Medicine Contact information: [place] [address] [phone]        Phillips Odor, MD. Schedule an appointment as soon as possible for a visit in 4 week(s).   Specialty: Neurology Contact information: [address] [place] [address] [phone]              No Known Allergies  Consultations:  None   Procedures/Studies: CT Code Stroke CTA Head W/WO contrast  Result Date: 11/10/2019 CLINICAL DATA:  Code stroke.  Confusion.  Speech disturbance. EXAM: CT ANGIOGRAPHY HEAD AND NECK TECHNIQUE: Multidetector CT imaging of the head and neck was performed using the standard protocol during bolus administration of intravenous contrast. Multiplanar CT image reconstructions and MIPs were obtained to evaluate the vascular anatomy. Carotid stenosis measurements (when applicable) are obtained utilizing NASCET criteria, using the distal internal carotid diameter as the denominator. CONTRAST:  68mL OMNIPAQUE IOHEXOL 350 MG/ML SOLN COMPARISON:  Head CT earlier same day. FINDINGS: CTA NECK FINDINGS Aortic arch: Normal. No atherosclerotic disease. Branching pattern is normal. Right carotid system: Common carotid artery widely patent to the bifurcation. Carotid bifurcation is normal without soft or calcified plaque. Cervical ICA is normal. Left carotid system: Common  carotid artery widely patent to bifurcation. Bifurcation is normal without soft or calcified plaque. Cervical ICA widely patent. Vertebral arteries: Both vertebral artery origins are widely patent. Both vertebral arteries are normal through the cervical region to the foramen magnum. Skeleton: Ordinary spondylosis C5-6. Other neck: No mass or lymphadenopathy. Upper chest: Normal Review of the MIP images confirms the above findings CTA HEAD FINDINGS Anterior circulation: Both internal carotid arteries are widely patent through the skull base and siphon regions. No siphon stenosis. The anterior and middle cerebral vessels are normal without large or medium vessel occlusion or proximal stenosis. Posterior circulation: Both vertebral arteries widely patent to the basilar. No basilar stenosis. Posterior circulation branch vessels are. Venous sinuses: Patent normal. Anatomic variants: None significant. Review of the MIP images confirms the above findings IMPRESSION: Normal CT angiography of the head and neck. No atherosclerotic disease. No large or medium vessel occlusion. Electronically Signed   By: Nelson Chimes M.D.   On: 11/10/2019 15:32   CT Code Stroke CTA Neck W/WO contrast  Result Date: 11/10/2019 CLINICAL DATA:  Code stroke.  Confusion.  Speech disturbance. EXAM: CT ANGIOGRAPHY HEAD AND NECK TECHNIQUE: Multidetector CT  imaging of the head and neck was performed using the standard protocol during bolus administration of intravenous contrast. Multiplanar CT image reconstructions and MIPs were obtained to evaluate the vascular anatomy. Carotid stenosis measurements (when applicable) are obtained utilizing NASCET criteria, using the distal internal carotid diameter as the denominator. CONTRAST:  19mL OMNIPAQUE IOHEXOL 350 MG/ML SOLN COMPARISON:  Head CT earlier same day. FINDINGS: CTA NECK FINDINGS Aortic arch: Normal. No atherosclerotic disease. Branching pattern is normal. Right carotid system: Common carotid  artery widely patent to the bifurcation. Carotid bifurcation is normal without soft or calcified plaque. Cervical ICA is normal. Left carotid system: Common carotid artery widely patent to bifurcation. Bifurcation is normal without soft or calcified plaque. Cervical ICA widely patent. Vertebral arteries: Both vertebral artery origins are widely patent. Both vertebral arteries are normal through the cervical region to the foramen magnum. Skeleton: Ordinary spondylosis C5-6. Other neck: No mass or lymphadenopathy. Upper chest: Normal Review of the MIP images confirms the above findings CTA HEAD FINDINGS Anterior circulation: Both internal carotid arteries are widely patent through the skull base and siphon regions. No siphon stenosis. The anterior and middle cerebral vessels are normal without large or medium vessel occlusion or proximal stenosis. Posterior circulation: Both vertebral arteries widely patent to the basilar. No basilar stenosis. Posterior circulation branch vessels are. Venous sinuses: Patent normal. Anatomic variants: None significant. Review of the MIP images confirms the above findings IMPRESSION: Normal CT angiography of the head and neck. No atherosclerotic disease. No large or medium vessel occlusion. Electronically Signed   By: Nelson Chimes M.D.   On: 11/10/2019 15:32   MR BRAIN W WO CONTRAST  Result Date: 11/11/2019 CLINICAL DATA:  Weakness and dizziness EXAM: MRI HEAD WITHOUT AND WITH CONTRAST TECHNIQUE: Multiplanar, multiecho pulse sequences of the brain and surrounding structures were obtained without and with intravenous contrast. CONTRAST:  32mL GADAVIST GADOBUTROL 1 MMOL/ML IV SOLN COMPARISON:  None. FINDINGS: Brain: There is no acute infarction or intracranial hemorrhage. There is no intracranial mass, mass effect, or edema. There is no hydrocephalus or extra-axial fluid collection. Ventricles and sulci are normal in size and configuration. Minimal small foci of T2 hyperintensity in  the supratentorial white matter are nonspecific but could reflect minor chronic microvascular ischemic changes. No abnormal enhancement. Vascular: Major vessel flow voids at the skull base are preserved. Skull and upper cervical spine: Normal marrow signal is preserved. Sinuses/Orbits: Paranasal sinuses are aerated. Orbits are unremarkable. Other: Incidental note is made of a partially empty sella. Mastoid air cells are clear. IMPRESSION: No evidence of recent infarction, hemorrhage, or mass. No abnormal enhancement. Electronically Signed   By: Macy Mis M.D.   On: 11/11/2019 10:00   US Carotid Bilateral  Result Date: 11/11/2019 CLINICAL DATA:  54 year old male with history of stroke and vision loss. EXAM: BILATERAL CAROTID DUPLEX ULTRASOUND TECHNIQUE: Pearline Cables scale imaging, color Doppler and duplex ultrasound were performed of bilateral carotid and vertebral arteries in the neck. COMPARISON:  None. FINDINGS: Criteria: Quantification of carotid stenosis is based on velocity parameters that correlate the residual internal carotid diameter with NASCET-based stenosis levels, using the diameter of the distal internal carotid lumen as the denominator for stenosis measurement. The following velocity measurements were obtained: RIGHT ICA: Peak systolic velocity 72 cm/sec, End diastolic velocity 33 cm/sec CCA: Peak systolic velocity 539 cm/sec SYSTOLIC ICA/CCA RATIO:  0.6 ECA: Peak systolic velocity 85 cm/sec LEFT ICA: Peak systolic velocity 63 cm/sec, End diastolic velocity 28 cm/sec CCA: 82 cm/sec SYSTOLIC ICA/CCA RATIO:  0.8 ECA: 91 cm/sec RIGHT CAROTID ARTERY: No atherosclerotic plaque formation. No significant tortuosity. Normal low resistance waveforms. RIGHT VERTEBRAL ARTERY:  Antegrade flow. LEFT CAROTID ARTERY: No atherosclerotic plaque formation. No significant tortuosity. Normal low resistance waveforms. LEFT VERTEBRAL ARTERY:  Antegrade flow. Upper extremity non-invasive blood pressures: Not obtained.  IMPRESSION: 1. Right carotid artery system: Patent without significant atherosclerotic plaque formation. 2. Left carotid artery system: Patent without significant atherosclerotic plaque formation. 3.  Vertebral artery system: Patent with antegrade flow bilaterally. Ruthann Cancer, MD [facility] Electronically Signed   By: Ruthann Cancer MD   On: 11/11/2019 11:05   ECHOCARDIOGRAM COMPLETE  Result Date: 11/11/2019    ECHOCARDIOGRAM REPORT   Patient Name:   DONTRELLE MAZON Date of Exam: 11/11/2019 Medical Rec #:  [id number]       Height:       70.0 in Accession #:    [id number]      Weight:       223.0 lb Date of Birth:  1965/08/14      BSA:          2.186 m Patient Age:    54 years        BP:           123/81 mmHg Patient Gender: M               HR:           59 bpm. Exam Location:  [facility] Procedure: 2D Echo Indications:    Stroke 434.91 / I163.9  History:        Patient has no prior history of Echocardiogram examinations.                 Stroke; Risk Factors:Non-Smoker.  Sonographer:    Leavy Cella RDCS (AE) Referring Phys: [id number] OLADAPO ADEFESO IMPRESSIONS  1. Left ventricular ejection fraction, by estimation, is 60 to 65%. The left ventricle has normal function. The left ventricle has no regional wall motion abnormalities. Left ventricular diastolic parameters are indeterminate.  2. Right ventricular systolic function is normal. The right ventricular size is normal.  3. The mitral valve is normal in structure. No evidence of mitral valve regurgitation. No evidence of mitral stenosis.  4. The aortic valve is tricuspid. Aortic valve regurgitation is not visualized. No aortic stenosis is present.  5. The inferior vena cava is normal in size with greater than 50% respiratory variability, suggesting right atrial pressure of 3 mmHg. FINDINGS  Left Ventricle: Left ventricular ejection fraction, by estimation, is 60 to 65%. The left ventricle has normal  function. The left ventricle has no regional wall motion abnormalities. The left ventricular internal cavity size was normal in size. There is  no left ventricular hypertrophy. Left ventricular diastolic parameters are indeterminate. Right Ventricle: The right ventricular size is normal. No increase in right ventricular wall thickness. Right ventricular systolic function is normal. Left Atrium: Left atrial size was normal in size. Right Atrium: Right atrial size was normal in size. Pericardium: There is no evidence of pericardial effusion. Mitral Valve: The mitral valve is normal in structure. No evidence of mitral valve regurgitation. No evidence of mitral valve stenosis. Tricuspid Valve: The tricuspid valve is normal in structure. Tricuspid valve regurgitation is not demonstrated. No evidence of tricuspid stenosis. Aortic Valve: The aortic valve is tricuspid. Aortic valve regurgitation is not visualized. No aortic stenosis is present. Aortic valve mean gradient measures 2.7 mmHg. Aortic valve peak gradient measures 5.3  mmHg. Aortic valve area, by VTI measures 2.39 cm. Pulmonic Valve: The pulmonic valve was not well visualized. Pulmonic valve regurgitation is not visualized. No evidence of pulmonic stenosis. Aorta: The aortic root is normal in size and structure. Pulmonary Artery: Indeterminant PASP, inadequate TR jet. Venous: The inferior vena cava is normal in size with greater than 50% respiratory variability, suggesting right atrial pressure of 3 mmHg. IAS/Shunts: No atrial level shunt detected by color flow Doppler.  LEFT VENTRICLE PLAX 2D LVIDd:         4.21 cm  Diastology LVIDs:         2.38 cm  LV e' medial:    6.85 cm/s LV PW:         1.00 cm  LV E/e' medial:  8.9 LV IVS:        1.12 cm  LV e' lateral:   9.68 cm/s LVOT diam:     2.00 cm  LV E/e' lateral: 6.3 LV SV:         59 LV SV Index:   27 LVOT Area:     3.14 cm  RIGHT VENTRICLE RV S prime:     10.80 cm/s TAPSE (M-mode): 2.6 cm LEFT ATRIUM              Index       RIGHT ATRIUM           Index LA diam:        3.70 cm 1.69 cm/m  RA Area:     10.80 cm LA Vol (A2C):   29.0 ml 13.27 ml/m RA Volume:   22.70 ml  10.38 ml/m LA Vol (A4C):   31.2 ml 14.27 ml/m LA Biplane Vol: 32.9 ml 15.05 ml/m  AORTIC VALVE AV Area (Vmax):    2.52 cm AV Area (Vmean):   2.21 cm AV Area (VTI):     2.39 cm AV Vmax:           115.54 cm/s AV Vmean:          77.509 cm/s AV VTI:            0.246 m AV Peak Grad:      5.3 mmHg AV Mean Grad:      2.7 mmHg LVOT Vmax:         92.55 cm/s LVOT Vmean:        54.448 cm/s LVOT VTI:          0.187 m LVOT/AV VTI ratio: 0.76  AORTA Ao Root diam: 3.10 cm MITRAL VALVE MV Area (PHT): 2.07 cm    SHUNTS MV Decel Time: 366 msec    Systemic VTI:  0.19 m MV E velocity: 60.70 cm/s  Systemic Diam: 2.00 cm MV A velocity: 67.30 cm/s MV E/A ratio:  0.90 Carlyle Dolly MD Electronically signed by Carlyle Dolly MD Signature Date/Time: 11/11/2019/10:30:11 AM    Final    CT HEAD CODE STROKE WO CONTRAST  Result Date: 11/10/2019 CLINICAL DATA:  Code stroke.  Confusion, abnormal speech EXAM: CT HEAD WITHOUT CONTRAST TECHNIQUE: Contiguous axial images were obtained from the base of the skull through the vertex without intravenous contrast. COMPARISON:  None. FINDINGS: Brain: There is no acute intracranial hemorrhage, mass effect, or edema. Gray-white differentiation is preserved. Ventricles and sulci are normal in size and configuration. There is no extra-axial fluid collection. Vascular: No hyperdense vessel. Skull: Unremarkable Sinuses/Orbits: No acute abnormality. Other: Mastoid air cells are clear. ASPECTS [facility] Stroke Program Early CT Score) - Ganglionic level  infarction (caudate, lentiform nuclei, internal capsule, insula, M1-M3 cortex): 7 - Supraganglionic infarction (M4-M6 cortex): 3 Total score (0-10 with 10 being normal): 10 IMPRESSION: No acute intracranial hemorrhage or evidence of acute infarction. ASPECT score is 10. These results were called by  telephone at the time of interpretation on 11/10/2019 at 2:14 pm to provider [facility] , who verbally acknowledged these results. Electronically Signed   By: Macy Mis M.D.   On: 11/10/2019 14:16     Discharge Exam: Vitals:   11/11/19 0538 11/11/19 1015  BP: 123/81 139/89  Pulse: (!) 59 (!) 49  Resp: 16 18  Temp: 97.8 F (36.6 C) 97.9 F (36.6 C)  SpO2: 95% 98%   Vitals:   11/11/19 0153 11/11/19 0309 11/11/19 0538 11/11/19 1015  BP: 127/85 116/73 123/81 139/89  Pulse: (!) 51 (!) 51 (!) 59 (!) 49  Resp: 17 16 16 18   Temp: 97.6 F (36.4 C) 97.6 F (36.4 C) 97.8 F (36.6 C) 97.9 F (36.6 C)  TempSrc: Oral Oral Oral Oral  SpO2: 96% 93% 95% 98%  Weight:      Height:        General: Pt is alert, awake, not in acute distress Cardiovascular: RRR, S1/S2 +, no rubs, no gallops Respiratory: CTA bilaterally, no wheezing, no rhonchi Abdominal: Soft, NT, ND, bowel sounds + Extremities: no edema, no cyanosis    The results of significant diagnostics from this hospitalization (including imaging, microbiology, ancillary and laboratory) are listed below for reference.     Microbiology: Recent Results (from the past 240 hour(s))  Respiratory Panel by RT PCR (Flu A&B, Covid) - Nasopharyngeal Swab     Status: None   Collection Time: 11/10/19  9:09 PM   Specimen: Nasopharyngeal Swab  Result Value Ref Range Status   SARS Coronavirus 2 by RT PCR NEGATIVE NEGATIVE Final    Comment: (NOTE) SARS-CoV-2 target nucleic acids are NOT DETECTED.  The SARS-CoV-2 RNA is generally detectable in upper respiratoy specimens during the acute phase of infection. The lowest concentration of SARS-CoV-2 viral copies this assay can detect is 131 copies/mL. A negative result does not preclude SARS-Cov-2 infection and should not be used as the sole basis for treatment or other patient management decisions. A negative result may occur with  improper specimen collection/handling, submission of  specimen other than nasopharyngeal swab, presence of viral mutation(s) within the areas targeted by this assay, and inadequate number of viral copies (<131 copies/mL). A negative result must be combined with clinical observations, patient history, and epidemiological information. The expected result is Negative.  Fact Sheet for Patients:  PinkCheek.be  Fact Sheet for Healthcare Providers:  GravelBags.it  This test is no t yet approved or cleared by the [place] FDA and  has been authorized for detection and/or diagnosis of SARS-CoV-2 by FDA under an Emergency Use Authorization (EUA). This EUA will remain  in effect (meaning this test can be used) for the duration of the COVID-19 declaration under Section 564(b)(1) of the Act, 21 U.S.C. section 360bbb-3(b)(1), unless the authorization is terminated or revoked sooner.     Influenza A by PCR NEGATIVE NEGATIVE Final   Influenza B by PCR NEGATIVE NEGATIVE Final    Comment: (NOTE) The Xpert Xpress SARS-CoV-2/FLU/RSV assay is intended as an aid in  the diagnosis of influenza from Nasopharyngeal swab specimens and  should not be used as a sole basis for treatment. Nasal washings and  aspirates are unacceptable for Xpert Xpress SARS-CoV-2/FLU/RSV  testing.  Fact  Sheet for Patients: PinkCheek.be  Fact Sheet for Healthcare Providers: GravelBags.it  This test is not yet approved or cleared by the [place] FDA and  has been authorized for detection and/or diagnosis of SARS-CoV-2 by  FDA under an Emergency Use Authorization (EUA). This EUA will remain  in effect (meaning this test can be used) for the duration of the  Covid-19 declaration under Section 564(b)(1) of the Act, 21  U.S.C. section 360bbb-3(b)(1), unless the authorization is  terminated or revoked. Performed at [facility], [address],  [place], [place] [address]      Labs: BNP (last 3 results) No results for input(s): BNP in the last 8760 hours. Basic Metabolic Panel: Recent Labs  Lab 11/10/19 1420 11/10/19 1427  NA 133* 136  K 4.0 4.3  CL 99 101  CO2 24  --   GLUCOSE 98 98  BUN 14 15  CREATININE 0.82 0.80  CALCIUM 9.3  --    Liver Function Tests: Recent Labs  Lab 11/10/19 1420  AST 26  ALT 36  ALKPHOS 70  BILITOT 1.0  PROT 7.6  ALBUMIN 4.3   No results for input(s): LIPASE, AMYLASE in the last 168 hours. No results for input(s): AMMONIA in the last 168 hours. CBC: Recent Labs  Lab 11/10/19 1427 11/10/19 1541 11/11/19 0552  WBC  --  5.3 6.8  NEUTROABS  --  3.2  --   HGB 16.0 15.1 16.1  HCT 47.0 45.3 48.5  MCV  --  93.4 93.6  PLT  --  214 221   Cardiac Enzymes: No results for input(s): CKTOTAL, CKMB, CKMBINDEX, TROPONINI in the last 168 hours. BNP: Invalid input(s): POCBNP CBG: Recent Labs  Lab 11/10/19 1411  GLUCAP 99   D-Dimer No results for input(s): DDIMER in the last 72 hours. Hgb A1c Recent Labs    11/10/19 1541  HGBA1C 6.0*   Lipid Profile Recent Labs    11/11/19 0552  CHOL 196  HDL 40*  LDLCALC 127*  TRIG 146  CHOLHDL 4.9   Thyroid function studies No results for input(s): TSH, T4TOTAL, T3FREE, THYROIDAB in the last 72 hours.  Invalid input(s): FREET3 Anemia work up No results for input(s): VITAMINB12, FOLATE, FERRITIN, TIBC, IRON, RETICCTPCT in the last 72 hours. Urinalysis No results found for: COLORURINE, APPEARANCEUR, [place], [place], [place], [place], [place], [place], PROTEINUR, UROBILINOGEN, NITRITE, LEUKOCYTESUR Sepsis Labs Invalid input(s): PROCALCITONIN,  WBC,  LACTICIDVEN Microbiology Recent Results (from the past 240 hour(s))  Respiratory Panel by RT PCR (Flu A&B, Covid) - Nasopharyngeal Swab     Status: None   Collection Time: 11/10/19  9:09 PM   Specimen: Nasopharyngeal Swab  Result Value Ref Range Status   SARS Coronavirus 2 by RT PCR  NEGATIVE NEGATIVE Final    Comment: (NOTE) SARS-CoV-2 target nucleic acids are NOT DETECTED.  The SARS-CoV-2 RNA is generally detectable in upper respiratoy specimens during the acute phase of infection. The lowest concentration of SARS-CoV-2 viral copies this assay can detect is 131 copies/mL. A negative result does not preclude SARS-Cov-2 infection and should not be used as the sole basis for treatment or other patient management decisions. A negative result may occur with  improper specimen collection/handling, submission of specimen other than nasopharyngeal swab, presence of viral mutation(s) within the areas targeted by this assay, and inadequate number of viral copies (<131 copies/mL). A negative result must be combined with clinical observations, patient history, and epidemiological information. The expected result is Negative.  Fact Sheet for Patients:  PinkCheek.be  Fact Sheet for Healthcare Providers:  GravelBags.it  This test is no t yet approved or cleared by the [place] FDA and  has been authorized for detection and/or diagnosis of SARS-CoV-2 by FDA under an Emergency Use Authorization (EUA). This EUA will remain  in effect (meaning this test can be used) for the duration of the COVID-19 declaration under Section 564(b)(1) of the Act, 21 U.S.C. section 360bbb-3(b)(1), unless the authorization is terminated or revoked sooner.     Influenza A by PCR NEGATIVE NEGATIVE Final   Influenza B by PCR NEGATIVE NEGATIVE Final    Comment: (NOTE) The Xpert Xpress SARS-CoV-2/FLU/RSV assay is intended as an aid in  the diagnosis of influenza from Nasopharyngeal swab specimens and  should not be used as a sole basis for treatment. Nasal washings and  aspirates are unacceptable for Xpert Xpress SARS-CoV-2/FLU/RSV  testing.  Fact Sheet for Patients: PinkCheek.be  Fact Sheet for  Healthcare Providers: GravelBags.it  This test is not yet approved or cleared by the [place] FDA and  has been authorized for detection and/or diagnosis of SARS-CoV-2 by  FDA under an Emergency Use Authorization (EUA). This EUA will remain  in effect (meaning this test can be used) for the duration of the  Covid-19 declaration under Section 564(b)(1) of the Act, 21  U.S.C. section 360bbb-3(b)(1), unless the authorization is  terminated or revoked. Performed at [facility], [address] [place], [place] [address]      Time coordinating discharge: 35 minutes  SIGNED:   Rodena Goldmann, DO [facility] 11/11/2019, 12:15 PM  If 7PM-7AM, please contact night-coverage [email or web address]

## 2019-11-11 NOTE — Progress Notes (Signed)
PT Cancellation Note  Patient Details Name: Steven Conway MRN: [id number] DOB: 1965-02-24   Cancelled Treatment:    Reason Eval/Treat Not Completed: PT screened, no needs identified, will sign off.  Patient demonstrates good return for ambulation in room, hallways on level, inclined and declined surfaces without loss of balance.    8:56 AM, 11/11/19 Lonell Grandchild, MPT Physical Therapist with [facility] [phone] office [phone] mobile phone

## 2019-11-11 NOTE — Progress Notes (Signed)
Pt discharged from facility. Left with wife. Discharge teaching explained to pt and wife bedside no further questions from either at this time.

## 2019-11-11 NOTE — Progress Notes (Signed)
OT Screen Note  Patient Details Name: Steven Conway MRN: [id number] DOB: 10-Mar-1965   Cancelled Treatment:    Reason Eval/Treat Not Completed: OT screened, no needs identified, will sign off.  Spoke with patient in room who reports that he just completed his PT evaluation. Pt lives with his wife in a 2 level home although lives on the main level, 1 step into the home from the garage. 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 Pt is independent at baseline and is currently functioning at that level. No difficulties with strength or coordination reported. Pt reports no deficits overall and is comfortable returning home at discharge. No follow up OT services needed at discharge.   Ailene Ravel, OTR/L,CBIS  [phone]  11/11/2019, 9:06 AM

## 2019-11-11 NOTE — Progress Notes (Signed)
*  PRELIMINARY RESULTS* Echocardiogram 2D Echocardiogram has been performed.  Steven Conway 11/11/2019, 9:19 AM

## 2019-11-25 NOTE — Progress Notes (Signed)
CODE STROKE CT TIMES 1356 call time 1401 exam started 1402 exam finished 1402 images sent to [place] exam completed in [facility] 1403 [facility] radiology called

## 2019-11-26 DIAGNOSIS — R4189 Other symptoms and signs involving cognitive functions and awareness: Secondary | ICD-10-CM | POA: Diagnosis not present

## 2020-01-05 DIAGNOSIS — N529 Male erectile dysfunction, unspecified: Secondary | ICD-10-CM | POA: Diagnosis not present

## 2020-01-05 DIAGNOSIS — F324 Major depressive disorder, single episode, in partial remission: Secondary | ICD-10-CM | POA: Diagnosis not present

## 2020-01-05 DIAGNOSIS — Z125 Encounter for screening for malignant neoplasm of prostate: Secondary | ICD-10-CM | POA: Diagnosis not present

## 2020-01-05 DIAGNOSIS — Z0000 Encounter for general adult medical examination without abnormal findings: Secondary | ICD-10-CM | POA: Diagnosis not present

## 2020-01-05 DIAGNOSIS — Z856 Personal history of leukemia: Secondary | ICD-10-CM | POA: Diagnosis not present

## 2020-01-05 DIAGNOSIS — I10 Essential (primary) hypertension: Secondary | ICD-10-CM | POA: Diagnosis not present

## 2020-02-07 DIAGNOSIS — F324 Major depressive disorder, single episode, in partial remission: Secondary | ICD-10-CM | POA: Diagnosis not present

## 2020-02-07 DIAGNOSIS — H9313 Tinnitus, bilateral: Secondary | ICD-10-CM | POA: Diagnosis not present

## 2020-02-07 DIAGNOSIS — N529 Male erectile dysfunction, unspecified: Secondary | ICD-10-CM | POA: Diagnosis not present

## 2020-02-07 DIAGNOSIS — G453 Amaurosis fugax: Secondary | ICD-10-CM | POA: Diagnosis not present

## 2020-03-09 DIAGNOSIS — H903 Sensorineural hearing loss, bilateral: Secondary | ICD-10-CM | POA: Diagnosis not present

## 2020-03-09 DIAGNOSIS — R42 Dizziness and giddiness: Secondary | ICD-10-CM | POA: Diagnosis not present

## 2020-03-09 DIAGNOSIS — H9313 Tinnitus, bilateral: Secondary | ICD-10-CM | POA: Diagnosis not present

## 2020-03-09 DIAGNOSIS — H9319 Tinnitus, unspecified ear: Secondary | ICD-10-CM | POA: Diagnosis not present

## 2020-03-22 DIAGNOSIS — R42 Dizziness and giddiness: Secondary | ICD-10-CM | POA: Diagnosis not present

## 2020-03-27 DIAGNOSIS — E349 Endocrine disorder, unspecified: Secondary | ICD-10-CM | POA: Diagnosis not present

## 2020-03-27 DIAGNOSIS — N5201 Erectile dysfunction due to arterial insufficiency: Secondary | ICD-10-CM | POA: Diagnosis not present

## 2020-04-03 DIAGNOSIS — N529 Male erectile dysfunction, unspecified: Secondary | ICD-10-CM | POA: Diagnosis not present

## 2020-04-03 DIAGNOSIS — I10 Essential (primary) hypertension: Secondary | ICD-10-CM | POA: Diagnosis not present

## 2020-04-03 DIAGNOSIS — F324 Major depressive disorder, single episode, in partial remission: Secondary | ICD-10-CM | POA: Diagnosis not present

## 2020-04-03 DIAGNOSIS — Z856 Personal history of leukemia: Secondary | ICD-10-CM | POA: Diagnosis not present

## 2020-04-07 DIAGNOSIS — R42 Dizziness and giddiness: Secondary | ICD-10-CM | POA: Diagnosis not present

## 2020-05-09 ENCOUNTER — Other Ambulatory Visit: Payer: Self-pay

## 2020-05-09 ENCOUNTER — Encounter: Payer: Self-pay | Admitting: Neurology

## 2020-05-09 ENCOUNTER — Ambulatory Visit (INDEPENDENT_AMBULATORY_CARE_PROVIDER_SITE_OTHER): Payer: BC Managed Care – PPO | Admitting: Neurology

## 2020-05-09 VITALS — BP 133/90 | HR 71 | Ht 70.0 in | Wt 219.0 lb

## 2020-05-09 DIAGNOSIS — R42 Dizziness and giddiness: Secondary | ICD-10-CM

## 2020-05-09 DIAGNOSIS — R2689 Other abnormalities of gait and mobility: Secondary | ICD-10-CM

## 2020-05-09 DIAGNOSIS — H81399 Other peripheral vertigo, unspecified ear: Secondary | ICD-10-CM

## 2020-05-09 NOTE — Progress Notes (Signed)
Subjective:    Patient ID: Steven Conway is a 55 y.o. male.  HPI     [name], MD, PhD [facility] [address] [place], [place] [address]  Dear Dr. Pryor Ochoa,   I saw your patient, Steven Conway, upon your kind request in my neurologic clinic today for initial consultation of his dizziness.  The patient is unaccompanied today.  As you know, Steven Conway is a 55 year old right-handed gentleman with an underlying medical history of TIA, leukemia with status post chemotherapy, obesity, history of visual disturbance, and hypertension, who reports intermittent dizziness and positional spinning sensation for the past 2 years.  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  I reviewed your a [facility] report from 03/22/20, which showed no spontaneous nystagmus but after irrigation he became nauseated and could not keep his eyes open.  No evidence of BPPV. We will request your referral and office records, which are currently not available for my review today.  He reports that he has been referred to physical therapy but has not made an appointment with PT yet.  He reports that his spinning sensation has caused him to feel off balance and he has fallen, thankfully without any major injuries reported.  He reports no recurrent headaches, reports that he had a sleep study some 2 years ago when he was hospitalized in [place].  He did not have any sleep apnea at the time by his report.  He reports that cardiology was concerned about his low heart rate, which would go into the 40s at night.  He denies any sudden onset of one-sided weakness or numbness or tingling or droopy face or slurring of speech recently.  He tries to hydrate well with water.  He drinks alcohol occasionally, maybe once every other week or so.  He drinks caffeine in limitation, 1 cup of coffee in the mornings.  He reports a diagnosis of depression, he has been on  antidepressant medication through his primary care.  He feels that perhaps his antidepressant medication is not as effective any longer.  He has not talked to his PCP about this, he has not seen psychiatry.  He has seen an ophthalmologist.  He has an appointment pending for about 2 weeks from now for checkup.  He had blood work through his oncologist recently and was able to pull up results through the portal or his phone, on 05/01/2020 his BUN was 15, creatinine was 0.9, testing was done through [facility] cancer care in [facility].  He does not use a cane or walking aid. He was hospitalized in October 2021 for transient dysarthria and confusion as well as a prior history of repeated/intermittent vision loss.  He had work-up in the hospital and I reviewed the hospital records.  He had a head CT without contrast on 11/10/2019 and I reviewed the results: IMPRESSION: No acute intracranial hemorrhage or evidence of acute infarction.   He had a CT angiogram of the head with and without contrast on 11/10/2019 and I reviewed the results: IMPRESSION: Normal CT angiography of the head and neck. No atherosclerotic disease. No large or medium vessel occlusion.    He had a brain MRI with and without contrast on 11/11/2019 and I reviewed the results: IMPRESSION: No evidence of recent infarction, hemorrhage, or mass. No abnormal enhancement.   He had an ultrasound of the carotids bilaterally, on 11/11/2019 and I reviewed the results: IMPRESSION: 1. Right carotid artery  system: Patent without significant atherosclerotic plaque formation. 2. Left carotid artery system: Patent without significant atherosclerotic plaque formation. 3.  Vertebral artery system: Patent with antegrade flow bilaterally.   He had an echocardiogram complete, on 11/11/2019 and I reviewed the results: EF was estimated to be 60 to 65%, normal LV function.  No regional wall motion abnormalities.  No evidence of mitral valve regurgitation, no evidence of  mitral stenosis.  Aortic valve is tricuspid.  Aortic valve regurgitation is not visualized.  No aortic stenosis is present.  He recently saw Dr. Jaquita Folds at [facility] neurology on 11/26/2019 for cognitive complaints as well as vertigo and right hand tremor.  He was noted to score in the normal range on the neuropsychological screening test consistent with the diagnosis of subjective cognitive impairment.  His Past Medical History Is Significant For: Past Medical History:  Diagnosis Date  . Cancer ([place])   . Leukemia ([place])   . Stroke [facility])     His Past Surgical History Is Significant For: Past Surgical History:  Procedure Laterality Date  . APPENDECTOMY      His Family History Is Significant For: No family history on file.  His Social History Is Significant For: Social History   Socioeconomic History  . Marital status: Married    Spouse name: Not on file  . Number of children: Not on file  . Years of education: Not on file  . Highest education level: Not on file  Occupational History  . Not on file  Tobacco Use  . Smoking status: Never Smoker  . Smokeless tobacco: Never Used  Vaping Use  . Vaping Use: Never used  Substance and Sexual Activity  . Alcohol use: Yes    Comment: rarely  . Drug use: Never  . Sexual activity: Not on file  Other Topics Concern  . Not on file  Social History Narrative  . Not on file   Social Determinants of Health   Financial Resource Strain: Not on file  Food Insecurity: Not on file  Transportation Needs: Not on file  Physical Activity: Not on file  Stress: Not on file  Social Connections: Not on file    His Allergies Are:  No Known Allergies:   His Current Medications Are:  Outpatient Encounter Medications as of 05/09/2020  Medication Sig  . buPROPion (WELLBUTRIN SR) 100 MG 12 hr tablet Take 100 mg by mouth every morning.  . chlorthalidone (HYGROTON) 25 MG tablet Take 25 mg by mouth daily.  . citalopram (CELEXA) 40 MG tablet Take 40  mg by mouth daily.  . [DISCONTINUED] finasteride (PROPECIA) 1 MG tablet Take 1 mg by mouth daily.  . [DISCONTINUED] lisinopril (ZESTRIL) 10 MG tablet Take 10 mg by mouth daily.   No facility-administered encounter medications on file as of 05/09/2020.  :   Review of Systems:  Out of a complete 14 point review of systems, all are reviewed and negative with the exception of these symptoms as listed below:  Review of Systems  Neurological:       Hx of of leukemia. Here for consult on vertigo ENT eval completed.Pt reports sx are worse when he bends over and when he turns around quickly. Pt reports sx are consistently present everyday. Pt reports sx started several years back and have progressively worsened.     Objective:  Neurological Exam  Physical Exam Physical Examination:   Vitals:   05/09/20 1431  BP: 133/90  Pulse: 71   On orthostatic testing, standing blood  pressure was 134/85 with a pulse of 74.  He denies any orthostatic lightheadedness or vertiginous symptoms.  General Examination: The patient is a very pleasant 55 y.o. male in no acute distress. He appears well-developed and well-nourished and well groomed.   HEENT: Normocephalic, atraumatic, pupils are equal, round and reactive to light, extraocular tracking is well-preserved, no nystagmus, he has no vertiginous symptoms, he has no vertigo or dizziness with sudden head position changes today.  Hearing is grossly intact to conduction with tuning fork.  Tympanic membranes are clear bilaterally, possible unremarkable scarring noted on the R TM, no redness or swelling, no pustular lesions in the ear canal.  Face is symmetric with normal facial animation and normal facial sensation. Speech is clear with no dysarthria noted. There is no hypophonia. There is no lip, neck/head, jaw or voice tremor. Neck is supple with full range of passive and active motion. There are no carotid bruits on auscultation. Oropharynx exam reveals: mild  mouth dryness, adequate dental hygiene and mild airway crowding. Tongue protrudes centrally and palate elevates symmetrically.   Chest: Clear to auscultation without wheezing, rhonchi or crackles noted.  Heart: S1+S2+0, regular and normal without murmurs, rubs or gallops noted.   Abdomen: Soft, non-tender and non-distended with normal bowel sounds appreciated on auscultation.  Extremities: There is no pitting edema in the distal lower extremities bilaterally. Pedal pulses are intact.  Skin: Warm and dry without trophic changes noted. There are no varicose veins.  Musculoskeletal: exam reveals no obvious joint deformities, tenderness or joint swelling or erythema.   Neurologically:  Mental status: The patient is awake, alert and oriented in all 4 spheres. His immediate and remote memory, attention, language skills and fund of knowledge are appropriate. There is no evidence of aphasia, agnosia, apraxia or anomia. Speech is clear with normal prosody and enunciation. Thought process is linear. Mood is normal and affect is normal.  Cranial nerves II - XII are as described above under HEENT exam. In addition: shoulder shrug is normal with equal shoulder height noted. Motor exam: Normal bulk, strength and tone is noted. There is no drift, tremor or rebound. Romberg is negative but with sustained standing with his eyes closed he takes a step backwards and leans against the chair behind him. Reflexes are 2+ throughout. Babinski: Toes are flexor bilaterally. Fine motor skills and coordination: intact with normal finger taps, normal hand movements, normal rapid alternating patting, normal foot taps and normal foot agility.  Cerebellar testing: No dysmetria or intention tremor on finger to nose testing. Heel to shin is unremarkable bilaterally. There is no truncal or gait ataxia.  Sensory exam: intact to light touch, vibration, temperature sense in the upper and lower extremities.  Gait, station and balance:  He stands easily. No veering to one side is noted. No leaning to one side is noted. Posture is age-appropriate and stance is narrow based. Gait shows normal stride length and normal pace. No problems turning are noted.  Tandem walk is doable and unremarkable but when he turns, he suddenly leans backwards against the examination table behind him.   Assessment and Plan:   In summary, Steven Conway is a very pleasant 55 y.o.-year old male with an underlying medical history of TIA, leukemia with status post chemotherapy, obesity, history of visual disturbance, and hypertension, who presents for evaluation of his dizziness and concern for vertigo.  Examination is not telltale for positional vertigo.  Some of his symptoms may support positional vertigo but examination shows a nonspecific  issue with his balance, no telltale signs of neuropathy, parkinsonism, cerebellar dysfunction, history and examination also not supportive of stroke or TIA.  All in all, examination findings are benign with the exception of nonspecific issues noted with Romberg testing and tandem walk.  He did not have any vertiginous symptoms per se today and no orthostatic lightheadedness.  He is largely reassured today.  He is advised to consider using a cane for gait safety.  He declines using a cane.  He has been referred to physical therapy.  He is advised to call your office to see about the status of his PT referral.  He has additional questions regarding his depression and management thereof.  He is advised to talk to his primary care physician about depression and medication management, possible alternative treatment options and if need be a referral to psychiatry.  He has not considered a psychiatrist before.  In addition, he reports that he has had cognitive complaints.  He is advised to consider a revisit with Dr. Oleta Mouse at [facility] neurology whom he had seen for cognitive complaints in the recent past.  The patient had a brain MRI some 6  months ago.  I suggested to rule out a structural cause of his balance issue that we could repeat his brain MRI with and without contrast.  He was agreeable with this.  We talked about the importance of good hydration and getting enough rest, we talked about sleep apnea.  He reports snoring but also reports that he had a sleep study about 2 years ago in the hospital and was ruled out for sleep apnea at the time.  At this juncture, he is advised to follow-up with your office, I would be happy to review your referral and office records once we have these available.  From my end of things, I suggested we call him with his brain MRI results and so long as his MRI is stable and benign, we can see him back in this office on an as-needed basis.  I answered all his questions today and he was in agreement.   Thank you very much for allowing me to participate in the care of this nice patient. If I can be of any further assistance to you please do not hesitate to call me at [phone].  Sincerely,   [name], MD, PhD

## 2020-05-09 NOTE — Patient Instructions (Addendum)
It was nice to meet you today.  Your neurological exam is nonfocal with the exception that you have a nonspecific balance disorder.  You may have a component of positional vertigo but your balance issue appears to be more nonspecific.  I do believe you may benefit from physical therapy.  Please talk to Dr. Pryor Ochoa about the status of your physical therapy referral.  I would be happy to review his records once I get his referral and office records.  As discussed, I would be happy to order a brain MRI with and without contrast to rule out a structural cause of your symptoms, I will request an MRI with special attention to the inner ear canals.  You had a previous brain MRI some 6 months ago which was benign.  Your history and examination does not suggest stroke or mini stroke, which we call TIA or incoordination from an underlying issue with your cerebellum (which is the small part of the brain) or an underlying movement disorder such as Parkinson's disease or nerve damage, which we call neuropathy.  With regards to your depression, please follow-up with your primary care physician to discuss management and alternative treatment options or a referral to a psychiatrist.  Please continue to stay well-hydrated and well rested.  You may benefit from using a cane for gait safety as you do become off balance when closing your eyes.  Please follow-up with your ophthalmologist as scheduled.  So long as your brain MRI shows stable and benign findings, I would be happy to see you back in this clinic on an as-needed basis.  For your cognitive concerns, it may be worth your while to see Dr. Oleta Mouse at [facility] again.

## 2020-05-10 ENCOUNTER — Telehealth: Payer: Self-pay | Admitting: Neurology

## 2020-05-10 NOTE — Telephone Encounter (Signed)
LVM for pt to call back i need ph # on the back of the insurance card. No card was scanned in.

## 2020-05-16 DIAGNOSIS — E349 Endocrine disorder, unspecified: Secondary | ICD-10-CM | POA: Diagnosis not present

## 2020-05-22 ENCOUNTER — Ambulatory Visit: Payer: BC Managed Care – PPO

## 2020-05-22 NOTE — Patient Instructions (Incomplete)
VESTIBULAR AND BALANCE EVALUATION   HISTORY:  Subjective history of current problem: I saw your patient, Steven Conway, upon your kind request in my neurologic clinic today for initial consultation of his dizziness.  The patient is unaccompanied today.  As you know, Mr. Nickless is a 55 year old right-handed gentleman with an underlying medical history of TIA, leukemia with status post chemotherapy, obesity, history of visual disturbance, and hypertension, who reports intermittent dizziness and positional spinning sensation for the past 2 years.  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  I reviewed your a VNG report from 03/22/20, which showed no spontaneous nystagmus but after irrigation he became nauseated and could not keep his eyes open.  No evidence of BPPV. We will request your referral and office records, which are currently not available for my review today.  He reports that he has been referred to physical therapy but has not made an appointment with PT yet.  He reports that his spinning sensation has caused him to feel off balance and he has fallen, thankfully without any major injuries reported.  He reports no recurrent headaches, reports that he had a sleep study some 2 years ago when he was hospitalized in [place].  He did not have any sleep apnea at the time by his report.  He reports that cardiology was concerned about his low heart rate, which would go into the 40s at night.  He denies any sudden onset of one-sided weakness or numbness or tingling or droopy face or slurring of speech recently.  He tries to hydrate well with water.  He drinks alcohol occasionally, maybe once every other week or so.  He drinks caffeine in limitation, 1 cup of coffee in the mornings.  He reports a diagnosis of depression, he has been on antidepressant medication through his primary care.  He feels that perhaps his antidepressant medication is not as effective any longer.  He has  not talked to his PCP about this, he has not seen psychiatry.  He has seen an ophthalmologist.  He has an appointment pending for about 2 weeks from now for checkup.  He had blood work through his oncologist recently and was able to pull up results through the portal or his phone, on 05/01/2020 his BUN was 15, creatinine was 0.9, testing was done through [facility] cancer care in [facility].  He does not use a cane or walking aid. He was hospitalized in October 2021 for transient dysarthria and confusion as well as a prior history of repeated/intermittent vision loss.  He had work-up in the hospital and I reviewed the hospital records.  He had a head CT without contrast on 11/10/2019 and I reviewed the results: IMPRESSION: No acute intracranial hemorrhage or evidence of acute infarction.   He had a CT angiogram of the head with and without contrast on 11/10/2019 and I reviewed the results: IMPRESSION: Normal CT angiography of the head and neck. No atherosclerotic disease. No large or medium vessel occlusion.    He had a brain MRI with and without contrast on 11/11/2019 and I reviewed the results: IMPRESSION: No evidence of recent infarction, hemorrhage, or mass. No abnormal enhancement.   He had an ultrasound of the carotids bilaterally, on 11/11/2019 and I reviewed the results: IMPRESSION: 1. Right carotid artery system: Patent without significant atherosclerotic plaque formation. 2. Left carotid artery system: Patent without significant atherosclerotic plaque formation. 3.  Vertebral artery system: Patent with antegrade flow bilaterally.  He had an echocardiogram complete, on 11/11/2019 and I reviewed the results: EF was estimated to be 60 to 65%, normal LV function.  No regional wall motion abnormalities.  No evidence of mitral valve regurgitation, no evidence of mitral stenosis.  Aortic valve is tricuspid.  Aortic valve regurgitation is not visualized.  No aortic stenosis is present.  He  recently saw Dr. Jaquita Folds at [facility] neurology on 11/26/2019 for cognitive complaints as well as vertigo and right hand tremor.  He was noted to score in the normal range on the neuropsychological screening test consistent with the diagnosis of subjective cognitive impairment.  Description of dizziness: (vertigo, unsteadiness, lightheadedness, falling, general unsteadiness, whoozy, swimmy-headed sensation, aural fullness) Frequency:  Duration: Symptom nature: (motion provoked, positional, spontaneous, constant, variable, intermittent)   Provocative Factors: Easing Factors:  Progression of symptoms: (better, worse, no change since onset) History of similar episodes:  Falls (yes/no): Number of falls in past 6 months:   Prior Functional Level:   Auditory complaints (tinnitus, pain, drainage, hearing loss, aural fullness): Vision (diplopia, visual field loss, recent changes, last eye exam):  Red Flags: (dysarthria, dysphagia, drop attacks, bowel and bladder changes, recent weight loss/gain) Review of systems negative for red flags.     EXAMINATION  POSTURE:   NEUROLOGICAL SCREEN: (2+ unless otherwise noted.) N=normal  Ab=abnormal  Level Dermatome R L Myotome R L Reflex R L  C3 Anterior Neck N N Sidebend C2-3 N N Jaw CN V    C4 Top of Shoulder N N Shoulder Shrug C4 N N Hoffman's UMN    C5 Lateral Upper Arm N N Shoulder ABD C4-5 N N Biceps C5-6    C6 Lateral Arm/ Thumb N N Arm Flex/ Wrist Ext C5-6 N N Brachiorad. C5-6    C7 Middle Finger N N Arm Ext//Wrist Flex C6-7 N N Triceps C7    C8 4th & 5th Finger N N Flex/ Ext Carpi Ulnaris C8 N N Patellar (L3-4)    T1 Medial Arm N N Interossei T1 N N Gastrocnemius    L2 Medial thigh/groin N N Illiopsoas (L2-3) N N     L3 Lower thigh/med.knee N N Quadriceps (L3-4) N N     L4 Medial leg/lat thigh N N Tibialis Ant (L4-5) N N     L5 Lat. leg & dorsal foot N N EHL (L5) N N     S1 post/lat foot/thigh/leg N N Gastrocnemius (S1-2) N N     S2  Post./med. thigh & leg N N Hamstrings (L4-S3) N N       Cranial Nerves Visual acuity and visual fields are intact  Extraocular muscles are intact  Facial sensation is intact bilaterally  Facial strength is intact bilaterally  Hearing is normal as tested by gross conversation Palate elevates midline, normal phonation  Shoulder shrug strength is intact  Tongue protrudes midline    SOMATOSENSORY:         Sensation           Intact      Diminished         Absent  Light touch       COORDINATION: Finger to Nose: Normal Heel to Shin: Normal Pronator Drift: Negative Rapid Alternating Movements: Normal Finger to Thumb Opposition: Normal  MUSCULOSKELETAL SCREEN: Cervical Spine ROM: WFL and painless in all planes. No gross deficits identified   ROM:   MMT:   Functional Mobility:  Gait: Scanning of visual environment with gait is:    POSTURAL CONTROL  TESTS:   Clinical Test of Sensory Interaction for Balance    (CTSIB):  CONDITION TIME STRATEGY SWAY  Eyes open, firm surface 30 seconds ankle   Eyes closed, firm surface 30 seconds ankle   Eyes open, foam surface 30 seconds ankle   Eyes closed, foam surface 30 seconds ankle     OCULOMOTOR / VESTIBULAR TESTING:  Oculomotor Exam- Room Light  Findings Comments  Ocular Alignment {normal/abnormal/not examined:14677}   Ocular ROM {normal/abnormal/not examined:14677}   Spontaneous Nystagmus {normal/abnormal/not examined:14677}   Gaze-Holding Nystagmus {normal/abnormal/not examined:14677}   End-Gaze Nystagmus {normal/abnormal/not examined:14677}   Vergence (normal 2-3") {normal/abnormal/not examined:14677}   Smooth Pursuit {normal/abnormal/not examined:14677}   Cross-Cover Test {normal/abnormal/not examined:14677}   Saccades {normal/abnormal/not examined:14677}   VOR Cancellation {normal/abnormal/not examined:14677}   Left Head Impulse {normal/abnormal/not examined:14677}   Right Head Impulse {normal/abnormal/not  examined:14677}   Static Acuity {normal/abnormal/not examined:14677}   Dynamic Acuity {normal/abnormal/not examined:14677}     Oculomotor Exam- Fixation Suppressed  Findings Comments  Ocular Alignment {normal/abnormal/not examined:14677}   Spontaneous Nystagmus {normal/abnormal/not examined:14677}   Gaze-Holding Nystagmus {normal/abnormal/not examined:14677}   End-Gaze Nystagmus {normal/abnormal/not examined:14677}   Head Shaking Nystagmus {normal/abnormal/not examined:14677}   Pressure-Induced Nystagmus {normal/abnormal/not examined:14677}   Hyperventilation Induced Nystagmus {normal/abnormal/not examined:14677}   Skull Vibration Induced Nystagmus {normal/abnormal/not examined:14677}     BPPV TESTS:  Symptoms Duration Intensity Nystagmus  L Dix-Hallpike None   None  R Dix-Hallpike None   None  L Head Roll None   None  R Head Roll None   None  L Sidelying Test      R Sidelying Test        FUNCTIONAL OUTCOME MEASURES   Results Comments  BERG /56 Fall risk, in need of intervention  DGI /24   FGA /30   TUG seconds   5TSTS seconds   6 Minute Walk Test    10 Meter Gait Speed Self-selected: s = m/s; Fastest: s = m/s Below normative values for full community ambulation  ABC Scale %   DHI /100     ASSESSMENT Clinical Impression: Pt is a pleasant year-old male/male referred for difficulty with baalance. PT examination reveals deficits . Pt presents with deficits in strength, gait and balance. Pt will benefit from skilled PT services to address deficits in balance and decrease risk for future falls.   Low (stable): no personal factors/comorbidities, 1-2 body systems/activity limitations/participation restrictions   Moderate (evolving): 1-2 personal factors/comorbidities, 3 or more body systems/activity limitations/participation restrictions   High (unstable): 3 or more personal factors/comorbidities, 4 or more body systems/activity limitations/participation restrictions     PLAN Next Visit: HEP:    Pt will be independent with HEP in order to improve strength and balance in order to decrease fall risk and improve function at home and work.   Pt will improve BERG by at least 3 points in order to demonstrate clinically significant improvement in balance.    Pt will improve DGI by at least 3 points in order to demonstrate clinically significant improvement in balance and decreased risk for falls.  Pt will improve ABC by at least 13% in order to demonstrate clinically significant improvement in balance confidence.   Pt will decrease 5TSTS by at least 3 seconds in order to demonstrate clinically significant improvement in LE strength.  Pt will decrease TUG to below 14 seconds/decrease in order to demonstrate decreased fall risk.  Pt will decrease DHI score by at least 18 points in order to demonstrate clinically significant reduction in disability   Pt will  increase 6MWT by at least 55m (125ft) in order to demonstrate clinically significant improvement in cardiopulmonary endurance and community ambulation

## 2020-05-25 NOTE — Patient Instructions (Signed)
VESTIBULAR AND BALANCE EVALUATION   HISTORY:  Subjective history of current problem: The patient is unaccompanied today.  As you know, Steven Conway is a 55 year old right-handed gentleman with an underlying medical history of TIA, leukemia with status post chemotherapy, obesity, history of visual disturbance, and hypertension, who reports intermittent dizziness and positional spinning sensation for the past 2 years.  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  I reviewed your a VNG report from 03/22/20, which showed no spontaneous nystagmus but after irrigation he became nauseated and could not keep his eyes open.  No evidence of BPPV. We will request your referral and office records, which are currently not available for my review today.  He reports that he has been referred to physical therapy but has not made an appointment with PT yet.  He reports that his spinning sensation has caused him to feel off balance and he has fallen, thankfully without any major injuries reported.  He reports no recurrent headaches, reports that he had a sleep study some 2 years ago when he was hospitalized in [place].  He did not have any sleep apnea at the time by his report.  He reports that cardiology was concerned about his low heart rate, which would go into the 40s at night.  He denies any sudden onset of one-sided weakness or numbness or tingling or droopy face or slurring of speech recently.  He tries to hydrate well with water.  He drinks alcohol occasionally, maybe once every other week or so.  He drinks caffeine in limitation, 1 cup of coffee in the mornings.  He reports a diagnosis of depression, he has been on antidepressant medication through his primary care.  He feels that perhaps his antidepressant medication is not as effective any longer.  He has not talked to his PCP about this, he has not seen psychiatry.  He has seen an ophthalmologist.  He has an appointment pending for  about 2 weeks from now for checkup.  He had blood work through his oncologist recently and was able to pull up results through the portal or his phone, on 05/01/2020 his BUN was 15, creatinine was 0.9, testing was done through [facility] cancer care in [facility].  He does not use a cane or walking aid. He was hospitalized in October 2021 for transient dysarthria and confusion as well as a prior history of repeated/intermittent vision loss.  He had work-up in the hospital and I reviewed the hospital records.  He had a head CT without contrast on 11/10/2019 and I reviewed the results: IMPRESSION: No acute intracranial hemorrhage or evidence of acute infarction.   He had a CT angiogram of the head with and without contrast on 11/10/2019 and I reviewed the results: IMPRESSION: Normal CT angiography of the head and neck. No atherosclerotic disease. No large or medium vessel occlusion.    He had a brain MRI with and without contrast on 11/11/2019 and I reviewed the results: IMPRESSION: No evidence of recent infarction, hemorrhage, or mass. No abnormal enhancement.   He had an ultrasound of the carotids bilaterally, on 11/11/2019 and I reviewed the results: IMPRESSION: 1. Right carotid artery system: Patent without significant atherosclerotic plaque formation. 2. Left carotid artery system: Patent without significant atherosclerotic plaque formation. 3.  Vertebral artery system: Patent with antegrade flow bilaterally.   He had an echocardiogram complete, on 11/11/2019 and I reviewed the results: EF was estimated to be 60 to 65%, normal LV  function.  No regional wall motion abnormalities.  No evidence of mitral valve regurgitation, no evidence of mitral stenosis.  Aortic valve is tricuspid.  Aortic valve regurgitation is not visualized.  No aortic stenosis is present.  He recently saw Steven Conway at [facility] neurology on 11/26/2019 for cognitive complaints as well as vertigo and right hand tremor.  He was noted  to score in the normal range on the neuropsychological screening test consistent with the diagnosis of subjective cognitive impairment.  In summary, Steven Conway is a very pleasant 55 y.o.-year old male with an underlying medical history of TIA, leukemia with status post chemotherapy, obesity, history of visual disturbance, and hypertension, who presents for evaluation of his dizziness and concern for vertigo.  Examination is not telltale for positional vertigo.  Some of his symptoms may support positional vertigo but examination shows a nonspecific issue with his balance, no telltale signs of neuropathy, parkinsonism, cerebellar dysfunction, history and examination also not supportive of stroke or TIA.  All in all, examination findings are benign with the exception of nonspecific issues noted with Romberg testing and tandem walk.  He did not have any vertiginous symptoms per se today and no orthostatic lightheadedness.  He is largely reassured today.  He is advised to consider using a cane for gait safety.  He declines using a cane.  He has been referred to physical therapy.  He is advised to call your office to see about the status of his PT referral.  He has additional questions regarding his depression and management thereof.  He is advised to talk to his primary care physician about depression and medication management, possible alternative treatment options and if need be a referral to psychiatry.  He has not considered a psychiatrist before.  In addition, he reports that he has had cognitive complaints.  He is advised to consider a revisit with Steven Conway at [facility] neurology whom he had seen for cognitive complaints in the recent past.  The patient had a brain MRI some 6 months ago.  I suggested to rule out a structural cause of his balance issue that we could repeat his brain MRI with and without contrast.  He was agreeable with this.  We talked about the importance of good hydration and getting enough rest,  we talked about sleep apnea.  He reports snoring but also reports that he had a sleep study about 2 years ago in the hospital and was ruled out for sleep apnea at the time.  At this juncture, he is advised to follow-up with your office, I would be happy to review your referral and office records once we have these available.  From my end of things, I suggested we call him with his brain MRI results and so long as his MRI is stable and benign, we can see him back in this office on an as-needed basis.  I answered all his questions today and he was in agreement.   Description of dizziness: (vertigo, unsteadiness, lightheadedness, falling, general unsteadiness, whoozy, swimmy-headed sensation, aural fullness) Frequency:  Duration: Symptom nature: (motion provoked, positional, spontaneous, constant, variable, intermittent)   Provocative Factors: Easing Factors:  Progression of symptoms: (better, worse, no change since onset) History of similar episodes:  Falls (yes/no): Number of falls in past 6 months:   Prior Functional Level:   Auditory complaints (tinnitus, pain, drainage, hearing loss, aural fullness): Vision (diplopia, visual field loss, recent changes, last eye exam):  Red Flags: (dysarthria, dysphagia, drop attacks, bowel and  bladder changes, recent weight loss/gain) Review of systems negative for red flags.     EXAMINATION  POSTURE:   NEUROLOGICAL SCREEN: (2+ unless otherwise noted.) N=normal  Ab=abnormal  Level Dermatome R L Myotome R L Reflex R L  C3 Anterior Neck N N Sidebend C2-3 N N Jaw CN V    C4 Top of Shoulder N N Shoulder Shrug C4 N N Hoffman's UMN    C5 Lateral Upper Arm N N Shoulder ABD C4-5 N N Biceps C5-6    C6 Lateral Arm/ Thumb N N Arm Flex/ Wrist Ext C5-6 N N Brachiorad. C5-6    C7 Middle Finger N N Arm Ext//Wrist Flex C6-7 N N Triceps C7    C8 4th & 5th Finger N N Flex/ Ext Carpi Ulnaris C8 N N Patellar (L3-4)    T1 Medial Arm N N Interossei T1 N N  Gastrocnemius    L2 Medial thigh/groin N N Illiopsoas (L2-3) N N     L3 Lower thigh/med.knee N N Quadriceps (L3-4) N N     L4 Medial leg/lat thigh N N Tibialis Ant (L4-5) N N     L5 Lat. leg & dorsal foot N N EHL (L5) N N     S1 post/lat foot/thigh/leg N N Gastrocnemius (S1-2) N N     S2 Post./med. thigh & leg N N Hamstrings (L4-S3) N N       Cranial Nerves Visual acuity and visual fields are intact  Extraocular muscles are intact  Facial sensation is intact bilaterally  Facial strength is intact bilaterally  Hearing is normal as tested by gross conversation Palate elevates midline, normal phonation  Shoulder shrug strength is intact  Tongue protrudes midline    SOMATOSENSORY:         Sensation           Intact      Diminished         Absent  Light touch       COORDINATION: Finger to Nose: Normal Heel to Shin: Normal Pronator Drift: Negative Rapid Alternating Movements: Normal Finger to Thumb Opposition: Normal  MUSCULOSKELETAL SCREEN: Cervical Spine ROM: WFL and painless in all planes. No gross deficits identified   ROM:   MMT:   Functional Mobility:  Gait: Scanning of visual environment with gait is:    POSTURAL CONTROL TESTS:   Clinical Test of Sensory Interaction for Balance    (CTSIB):  CONDITION TIME STRATEGY SWAY  Eyes open, firm surface 30 seconds ankle   Eyes closed, firm surface 30 seconds ankle   Eyes open, foam surface 30 seconds ankle   Eyes closed, foam surface 30 seconds ankle     OCULOMOTOR / VESTIBULAR TESTING:  Oculomotor Exam- Room Light  Findings Comments  Ocular Alignment {normal/abnormal/not examined:14677}   Ocular ROM {normal/abnormal/not examined:14677}   Spontaneous Nystagmus {normal/abnormal/not examined:14677}   Gaze-Holding Nystagmus {normal/abnormal/not examined:14677}   End-Gaze Nystagmus {normal/abnormal/not examined:14677}   Vergence (normal 2-3") {normal/abnormal/not examined:14677}   Smooth Pursuit  {normal/abnormal/not examined:14677}   Cross-Cover Test {normal/abnormal/not examined:14677}   Saccades {normal/abnormal/not examined:14677}   VOR Cancellation {normal/abnormal/not examined:14677}   Left Head Impulse {normal/abnormal/not examined:14677}   Right Head Impulse {normal/abnormal/not examined:14677}   Static Acuity {normal/abnormal/not examined:14677}   Dynamic Acuity {normal/abnormal/not examined:14677}     Oculomotor Exam- Fixation Suppressed  Findings Comments  Ocular Alignment {normal/abnormal/not examined:14677}   Spontaneous Nystagmus {normal/abnormal/not examined:14677}   Gaze-Holding Nystagmus {normal/abnormal/not examined:14677}   End-Gaze Nystagmus {normal/abnormal/not examined:14677}   Head Shaking Nystagmus {normal/abnormal/not examined:14677}  Pressure-Induced Nystagmus {normal/abnormal/not examined:14677}   Hyperventilation Induced Nystagmus {normal/abnormal/not examined:14677}   Skull Vibration Induced Nystagmus {normal/abnormal/not examined:14677}     BPPV TESTS:  Symptoms Duration Intensity Nystagmus  L Dix-Hallpike None   None  R Dix-Hallpike None   None  L Head Roll None   None  R Head Roll None   None  L Sidelying Test      R Sidelying Test        FUNCTIONAL OUTCOME MEASURES   Results Comments  BERG /56 Fall risk, in need of intervention  DGI /24   FGA /30   TUG seconds   5TSTS seconds   6 Minute Walk Test    10 Meter Gait Speed Self-selected: s = m/s; Fastest: s = m/s Below normative values for full community ambulation  ABC Scale %   DHI /100     ASSESSMENT Clinical Impression: Pt is a pleasant year-old male/male referred for difficulty with baalance. PT examination reveals deficits . Pt presents with deficits in strength, gait and balance. Pt will benefit from skilled PT services to address deficits in balance and decrease risk for future falls.   Low (stable): no personal factors/comorbidities, 1-2 body systems/activity  limitations/participation restrictions   Moderate (evolving): 1-2 personal factors/comorbidities, 3 or more body systems/activity limitations/participation restrictions   High (unstable): 3 or more personal factors/comorbidities, 4 or more body systems/activity limitations/participation restrictions    PLAN Next Visit: HEP:    Pt will be independent with HEP in order to improve strength and balance in order to decrease fall risk and improve function at home and work.   Pt will improve BERG by at least 3 points in order to demonstrate clinically significant improvement in balance.    Pt will improve DGI by at least 3 points in order to demonstrate clinically significant improvement in balance and decreased risk for falls.  Pt will improve ABC by at least 13% in order to demonstrate clinically significant improvement in balance confidence.   Pt will decrease 5TSTS by at least 3 seconds in order to demonstrate clinically significant improvement in LE strength.  Pt will decrease TUG to below 14 seconds/decrease in order to demonstrate decreased fall risk.  Pt will decrease DHI score by at least 18 points in order to demonstrate clinically significant reduction in disability   Pt will increase 6MWT by at least 24m (153ft) in order to demonstrate clinically significant improvement in cardiopulmonary endurance and community ambulation

## 2020-05-26 ENCOUNTER — Other Ambulatory Visit: Payer: Self-pay

## 2020-05-26 ENCOUNTER — Ambulatory Visit: Payer: BC Managed Care – PPO | Attending: Otolaryngology

## 2020-05-26 DIAGNOSIS — R42 Dizziness and giddiness: Secondary | ICD-10-CM | POA: Diagnosis not present

## 2020-05-26 NOTE — Therapy (Signed)
[facility] [address] [place], [place], [address] Phone: [phone]   Fax:  [phone]  Physical Therapy Evaluation  Patient Details  Name: Steven Conway MRN: [id number] Date of Birth: Jul 07, 1965 Referring Provider (PT): Dr. Pryor Ochoa   Encounter Date: 05/26/2020   PT End of Session - 05/26/20 1633    Visit Number 1    Number of Visits 9    Date for PT Re-Evaluation 07/21/20    Authorization Type eval: 05/26/20    PT Start Time 1020    PT Stop Time 1105    PT Time Calculation (min) 45 min    Activity Tolerance Patient tolerated treatment well    Behavior During Therapy [facility] for tasks assessed/performed           Past Medical History:  Diagnosis Date  . Cancer ([place])   . Leukemia ([place])   . Stroke [facility])     Past Surgical History:  Procedure Laterality Date  . APPENDECTOMY      There were no vitals filed for this visit.    Subjective Assessment - 05/26/20 1610    Subjective Dizziness    Pertinent History Pt referred by ENT for dizziness. 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 Was in [facility] and completed 3 rounds of chemotherapy which finished July/August 2019. He started having dizziness and unsteadiness when his chemotherapy finished. He has seen multiple specialist for this issue. He had a VNG from [facility] which was positive for central findings of abnormal saccades and optokinetic testing. All BPPV testing was normal and calorics were symmetric. He had a head CT and head CT angiogram 11/10/19 which were both normal and also a brain MRI 11/11/19 which was WNL. Carotid dopplers 11/11/19 WNL and echocardiogram same day was WNL. He was having cognitive complaints around the same time and saw Dr. Oleta Mouse at [facility] neurology on 11/26/19. At that time his neuropsychological screening was WNL and pt was diagnosed with subjective cognitive impairment. He endorses that he doesn't feel like his antidepressants  have been effective and believes that anxiety may be contributing to his cognitive issues. With respect to his dizziness pt states that initially he would get symptoms when just standing still but that eventually that improved and now he only gets dizziness with head turns and position changes. Currently his worst symptoms occur with squating, bending over, and straining such as when lifting something heavy. His symptoms have been improving slightly recently. He has a history of HTN which he reports is well controlled and states that he also had a TIA in the past. At that time his primary symptoms were slurred speech and confusion. He also has a history of visual disturbance (double vision) in his R eye which started while he was receiving chemotherapy and was related to vascular changes in his his right eye.    Limitations Walking    Diagnostic tests See history    Patient Stated Goals Decrease dizziness    Currently in Pain? Other (Comment)   Unrelated to current episode            [facility] PT Assessment - 05/26/20 1611      Assessment   Medical Diagnosis Dizziness    Referring Provider (PT) Dr. 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 Was in [facility] and completed 3 rounds of chemotherapy which finished July/August 2019. He started having dizziness and unsteadiness when his chemotherapy finished. He has seen multiple specialist for this issue. He had a VNG from [facility] which was positive for central findings of abnormal saccades and optokinetic testing. All BPPV testing was normal and calorics were symmetric. He had a head CT and head CT angiogram 11/10/19 which were both normal and also a brain MRI 11/11/19 which was WNL. Carotid dopplers 11/11/19 WNL and echocardiogram same day was WNL. He was having cognitive complaints around the same time and saw Dr. Oleta Mouse at [facility] neurology on 11/26/19. At that time his neuropsychological screening was WNL and pt was diagnosed with subjective cognitive impairment. He endorses that he doesn't feel like his antidepressants have been effective and believes that anxiety may be contributing to his cognitive issues. With respect to his dizziness pt states that initially he would get symptoms when just standing still but that eventually that improved and now he only gets dizziness with head turns and position changes. Currently his worst symptoms occur with squating, bending over, and straining such as when lifting something heavy. His symptoms have been improving slightly recently. He has a history of HTN which he reports is well controlled and states that he also had a TIA in the past. At that time his primary symptoms were slurred speech and confusion. He also has a history of visual disturbance (double vision) in his R eye which started while he was receiving chemotherapy and was related to vascular changes in his his right eye.   Description of dizziness: vertigo (vertigo, unsteadiness, lightheadedness, falling, general unsteadiness, whoozy,  swimmy-headed sensation, aural fullness) Frequency: multiple times/day  Duration: 5-10s Symptom nature: motion provoked (motion provoked, positional, spontaneous, constant, variable, intermittent)   Provocative Factors: squatting, straining, looking up, bending over Easing Factors: rest  Progression of symptoms: (better, worse, no change since onset) slightly better History of similar episodes: None    Falls (yes/no): No falls but stumbles into walls Number of falls in past 6 months: None   Prior Functional Level: Independent with ADLs/IADLs, works full-time as Warehouse manager. 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     EXAMINATION  POSTURE: WNL  NEUROLOGICAL SCREEN: (2+ unless otherwise noted.) N=normal  Ab=abnormal  Level Dermatome R L Myotome R L Reflex R L  C3 Anterior Neck N N Sidebend C2-3 N N Jaw CN V    C4 Top of Shoulder N N Shoulder Shrug C4 N N Hoffman's UMN    C5 Lateral Upper Arm N N Shoulder ABD C4-5 N N Biceps C5-6    C6 Lateral Arm/ Thumb N N Arm Flex/ Wrist Ext C5-6 N N Brachiorad. C5-6    C7 Middle Finger N N Arm Ext//Wrist Flex C6-7 N N Triceps C7    C8 4th & 5th Finger N N Flex/ Ext Carpi Ulnaris C8 N N Patellar (L3-4)    T1 Medial Arm N N Interossei T1 N N Gastrocnemius    L2 Medial thigh/groin N N Illiopsoas (L2-3) N N     L3 Lower thigh/med.knee N N Quadriceps  (L3-4) N N     L4 Medial leg/lat thigh N N Tibialis Ant (L4-5) N N     L5 Lat. leg & dorsal foot N N EHL (L5) N N     S1 post/lat foot/thigh/leg N N Gastrocnemius (S1-2) N N     S2 Post./med. thigh & leg N N Hamstrings (L4-S3) N N       Cranial Nerves Visual acuity and visual fields are intact  Extraocular muscles are intact  Facial sensation is intact bilaterally  Facial strength is intact bilaterally  Hearing is normal as tested by gross conversation Palate elevates midline, normal phonation  Shoulder shrug strength is intact  Tongue protrudes midline  Pt reports occasional bilateral LE tremors in sitting and standing that were observed today   SOMATOSENSORY:         Sensation           Intact      Diminished         Absent  Light touch Normal      COORDINATION: Finger to Nose: Normal Heel to Shin: Normal Pronator Drift: Positive on L side Rapid Alternating Movements: Normal Finger to Thumb Opposition: Normal  MUSCULOSKELETAL SCREEN: Cervical Spine ROM: WFL and painless in all planes. No gross deficits identified   ROM: WNL  MMT: WNL  Functional Mobility: Independent for transfers and ambulation without assistive device    POSTURAL CONTROL TESTS:   Clinical Test of Sensory Interaction for Balance    (CTSIB): Deferred   OCULOMOTOR / VESTIBULAR TESTING:  Oculomotor Exam- Room Light  Findings Comments  Ocular Alignment abnormal Possible mild esotropia in R eye  Ocular ROM normal   Spontaneous Nystagmus normal   Gaze-Holding Nystagmus normal   End-Gaze Nystagmus normal   Vergence (normal 2-3") abnormal 12"  Smooth Pursuit normal   Cross-Cover Test normal   Saccades normal   VOR Cancellation abnormal Pt reports dizziness  Left Head Impulse normal   Right Head Impulse normal   Static Acuity not examined   Dynamic Acuity not examined     Oculomotor Exam- Fixation Suppressed: Deferred  BPPV TESTS:  Symptoms Duration Intensity Nystagmus  L Dix-Hallpike  Mild dizziness   None  R Dix-Hallpike Severe dizziness   None  L Head Roll None   None  R Head Roll None   None  L Sidelying Test      R Sidelying Test  FUNCTIONAL OUTCOME MEASURES   Results Comments  BERG NT   DGI NT   FGA NT   5TSTS NT   FOTO 45 Predicted improvement to 60  10 Meter Gait Speed NT   ABC Scale NT   DHI 42/100 Moderate perception of disability                Objective measurements completed on examination: See above findings.               PT Education - 05/26/20 1632    Education Details Plan of care    Person(s) Educated Patient    Methods Explanation    Comprehension Verbalized understanding            PT Short Term Goals - 05/26/20 1635      PT SHORT TERM GOAL #1   Title Pt will be independent with HEP to improve dizziness and balance in order to decrease fall risk and improve symptom-free function at home and with work.    Time 4    Period Weeks    Status New    Target Date 06/23/20             PT Long Term Goals - 05/26/20 1638      PT LONG TERM GOAL #1   Title Pt will increase his FOTO to at least 60 in order to demonstrate significant improvement in his function related to his dizziness.    Baseline 05/26/20: 45    Time 8    Period Weeks    Status New    Target Date 07/21/20      PT LONG TERM GOAL #2   Title Pt will decrease DHI score by at least 18 points in order to demonstrate clinically significant reduction in disability    Baseline 05/26/20: 42/100    Time 8    Period Weeks    Status New    Target Date 07/21/20      PT LONG TERM GOAL #3   Title Pt will report at least 75% improvement in his symptoms to improve his ability to complete all responsibilities at home and work with less dizziness.    Time 8    Period Weeks    Status New    Target Date 07/21/20                  Plan - 05/26/20 1633    Clinical Impression Statement Pt is a pleasant 55 year-old male referred for  dizziness. During examination pt reports dizziness when sitting with eyes closed and during VOR cancellation testing. He reports some complaints of double vision and he was encouraged to discuss this issue with his eye doctor. Abnormal convergence at 12" and possible mild esotropia observed in R eyes but difficult to determine. Not observed with cross-cover testing. He reports severe dizziness in R Dix-Hallpike position but no nystagmus observed. Fixation suppression testing with infrared goggles will be performed at next visit as well as additional balance testing. Pt will benefit from skilled PT services to address deficits in balance and dizziness to decrease risk for falls and improve his symptom-free function at home and work.    Personal Factors and Comorbidities Comorbidity 3+    Comorbidities HTN, TIA, anxiety/depression, AML    Examination-Activity Limitations Squat;Bend;Transfers    Examination-Participation Restrictions Community Activity;Driving;Occupation;Shop    Stability/Clinical Decision Making Unstable/Unpredictable    Clinical Decision Making High    Rehab Potential Fair    PT Frequency 1x /  week    PT Duration 8 weeks    PT Treatment/Interventions ADLs/Self Care Home Management;Aquatic Therapy;Biofeedback;Canalith Repostioning;Cryotherapy;Electrical Stimulation;Moist Heat;Traction;Ultrasound;DME Instruction;Gait training;Functional mobility training;Therapeutic activities;Therapeutic exercise;Balance training;Neuromuscular re-education;Cognitive remediation;Patient/family education;Manual techniques;Passive range of motion;Dry needling;Vestibular;Visual/perceptual remediation/compensation;Spinal Manipulations;Joint Manipulations    PT Next Visit Plan fixation suppression testing, mCTSIB, DGI/FGA, BERG, initiate HEP    PT Home Exercise Plan None currently           Patient will benefit from skilled therapeutic intervention in order to improve the following deficits and  impairments:  Dizziness  Visit Diagnosis: Dizziness and giddiness - Plan: PT plan of care cert/re-cert     Problem List Patient Active Problem List   Diagnosis Date Noted  . Slurred speech 11/11/2019  . Confusion 11/11/2019  . CVA (cerebral vascular accident) ([place]) 11/11/2019  . Acute CVA (cerebrovascular accident) ([place]) 11/10/2019   Phillips Grout PT, DPT, GCS  Zakhia Seres 05/26/2020, 4:51 PM  [facility] [address] [place], [place], [address] Phone: [phone]   Fax:  [phone]  Name: Kadrien Vanorden MRN: [id number] Date of Birth: September 28, 1965

## 2020-05-29 NOTE — Patient Instructions (Signed)
fixation suppression testing, mCTSIB, DGI/FGA, BERG, initiate HEP   TREATMENT   Neuromuscular Re-education   Clinical Test of Sensory Interaction for Balance    (CTSIB):  CONDITION TIME STRATEGY SWAY  Eyes open, firm surface 30 seconds ankle   Eyes closed, firm surface 30 seconds ankle   Eyes open, foam surface 30 seconds ankle   Eyes closed, foam surface 30 seconds ankle     Oculomotor Exam- Fixation Suppressed  Findings Comments  Ocular Alignment {normal/abnormal/not examined:14677}   Spontaneous Nystagmus {normal/abnormal/not examined:14677}   Gaze-Holding Nystagmus {normal/abnormal/not examined:14677}   End-Gaze Nystagmus {normal/abnormal/not examined:14677}   Head Shaking Nystagmus {normal/abnormal/not examined:14677}   Pressure-Induced Nystagmus {normal/abnormal/not examined:14677}   Hyperventilation Induced Nystagmus {normal/abnormal/not examined:14677}   Skull Vibration Induced Nystagmus {normal/abnormal/not examined:14677}           05/26/20 05/30/20 Comments  BERG NT    DGI NT    FGA NT    5TSTS NT    FOTO 45  Predicted improvement to 60  10 Meter Gait Speed NT    ABC Scale NT    DHI 42/100  Moderate perception of disability

## 2020-05-30 ENCOUNTER — Other Ambulatory Visit: Payer: Self-pay

## 2020-05-30 ENCOUNTER — Ambulatory Visit: Payer: BC Managed Care – PPO

## 2020-05-30 DIAGNOSIS — R42 Dizziness and giddiness: Secondary | ICD-10-CM

## 2020-06-05 NOTE — Therapy (Signed)
[facility] [address] [place], [place], [address] Phone: [phone]   Fax:  [phone]  Patient Details  Name: Steven Conway MRN: [id number] Date of Birth: [birth year] Referring Provider:  Carloyn Manner, MD  Encounter Date: 05/30/2020  Pt did not show for this appointment.  Lyndel Safe Khalilah Hoke PT, DPT, GCS  [place] Sigmon 06/05/2020, 10:43 AM  [facility] [address] [place], [place], [address] Phone: [phone]   Fax:  [phone]

## 2020-06-05 NOTE — Patient Instructions (Incomplete)
fixation suppression testing, mCTSIB, DGI/FGA, BERG, initiate HEP   TREATMENT   Neuromuscular Re-education    Clinical Test of Sensory Interaction for Balance    (CTSIB):  CONDITION TIME STRATEGY SWAY  Eyes open, firm surface 30 seconds ankle   Eyes closed, firm surface 30 seconds ankle   Eyes open, foam surface 30 seconds ankle   Eyes closed, foam surface 30 seconds ankle     Oculomotor Exam- Fixation Suppressed  Findings Comments  Ocular Alignment {normal/abnormal/not examined:14677}   Spontaneous Nystagmus {normal/abnormal/not examined:14677}   Gaze-Holding Nystagmus {normal/abnormal/not examined:14677}   End-Gaze Nystagmus {normal/abnormal/not examined:14677}   Head Shaking Nystagmus {normal/abnormal/not examined:14677}   Pressure-Induced Nystagmus {normal/abnormal/not examined:14677}   Hyperventilation Induced Nystagmus {normal/abnormal/not examined:14677}   Skull Vibration Induced Nystagmus {normal/abnormal/not examined:14677}     BPPV TESTS:  Symptoms Duration Intensity Nystagmus  L Dix-Hallpike None   None  R Dix-Hallpike None   None  L Head Roll None   None  R Head Roll None   None  L Sidelying Test      R Sidelying Test        FUNCTIONAL OUTCOME MEASURES   Results Comments  BERG NT   DGI NT   FGA NT   5TSTS NT   FOTO 45 Predicted improvement to 60  10 Meter Gait Speed NT   ABC Scale NT   DHI 42/100 Moderate perception of disability

## 2020-06-06 ENCOUNTER — Other Ambulatory Visit: Payer: Self-pay

## 2020-06-06 ENCOUNTER — Ambulatory Visit: Payer: BC Managed Care – PPO

## 2020-06-06 ENCOUNTER — Ambulatory Visit: Payer: BC Managed Care – PPO | Attending: Otolaryngology

## 2020-06-06 DIAGNOSIS — R42 Dizziness and giddiness: Secondary | ICD-10-CM

## 2020-06-30 DIAGNOSIS — E291 Testicular hypofunction: Secondary | ICD-10-CM | POA: Diagnosis not present

## 2020-06-30 DIAGNOSIS — N5201 Erectile dysfunction due to arterial insufficiency: Secondary | ICD-10-CM | POA: Diagnosis not present

## 2020-07-07 DIAGNOSIS — H53411 Scotoma involving central area, right eye: Secondary | ICD-10-CM | POA: Diagnosis not present

## 2020-07-07 DIAGNOSIS — H35371 Puckering of macula, right eye: Secondary | ICD-10-CM | POA: Diagnosis not present

## 2020-07-07 DIAGNOSIS — G453 Amaurosis fugax: Secondary | ICD-10-CM | POA: Diagnosis not present

## 2020-07-07 DIAGNOSIS — H3589 Other specified retinal disorders: Secondary | ICD-10-CM | POA: Diagnosis not present

## 2020-07-20 DIAGNOSIS — E291 Testicular hypofunction: Secondary | ICD-10-CM | POA: Diagnosis not present

## 2020-10-13 DIAGNOSIS — M549 Dorsalgia, unspecified: Secondary | ICD-10-CM | POA: Diagnosis not present

## 2020-11-02 DIAGNOSIS — L918 Other hypertrophic disorders of the skin: Secondary | ICD-10-CM | POA: Diagnosis not present

## 2020-11-02 DIAGNOSIS — K644 Residual hemorrhoidal skin tags: Secondary | ICD-10-CM | POA: Diagnosis not present

## 2020-11-02 DIAGNOSIS — K640 First degree hemorrhoids: Secondary | ICD-10-CM | POA: Diagnosis not present

## 2020-11-02 DIAGNOSIS — K649 Unspecified hemorrhoids: Secondary | ICD-10-CM | POA: Diagnosis not present

## 2020-11-02 DIAGNOSIS — Z1212 Encounter for screening for malignant neoplasm of rectum: Secondary | ICD-10-CM | POA: Diagnosis not present

## 2020-11-02 DIAGNOSIS — Z1211 Encounter for screening for malignant neoplasm of colon: Secondary | ICD-10-CM | POA: Diagnosis not present

## 2020-11-02 DIAGNOSIS — Z856 Personal history of leukemia: Secondary | ICD-10-CM | POA: Diagnosis not present

## 2020-11-02 DIAGNOSIS — Z79899 Other long term (current) drug therapy: Secondary | ICD-10-CM | POA: Diagnosis not present

## 2020-11-02 DIAGNOSIS — K648 Other hemorrhoids: Secondary | ICD-10-CM | POA: Diagnosis not present

## 2020-11-02 DIAGNOSIS — K5730 Diverticulosis of large intestine without perforation or abscess without bleeding: Secondary | ICD-10-CM | POA: Diagnosis not present

## 2020-12-05 DIAGNOSIS — F324 Major depressive disorder, single episode, in partial remission: Secondary | ICD-10-CM | POA: Diagnosis not present

## 2020-12-05 DIAGNOSIS — H9313 Tinnitus, bilateral: Secondary | ICD-10-CM | POA: Diagnosis not present

## 2020-12-05 DIAGNOSIS — R42 Dizziness and giddiness: Secondary | ICD-10-CM | POA: Diagnosis not present

## 2020-12-21 DIAGNOSIS — K648 Other hemorrhoids: Secondary | ICD-10-CM | POA: Diagnosis not present

## 2021-01-22 ENCOUNTER — Other Ambulatory Visit: Payer: Self-pay | Admitting: Family Medicine

## 2021-01-22 ENCOUNTER — Ambulatory Visit
Admission: RE | Admit: 2021-01-22 | Discharge: 2021-01-22 | Disposition: A | Discharge status: Home / Self Care | Payer: BC Managed Care – PPO | Source: Ambulatory Visit | Attending: Family Medicine | Admitting: Family Medicine

## 2021-01-22 ENCOUNTER — Other Ambulatory Visit: Payer: Self-pay

## 2021-01-22 DIAGNOSIS — R059 Cough, unspecified: Secondary | ICD-10-CM | POA: Diagnosis not present

## 2021-01-22 DIAGNOSIS — Z856 Personal history of leukemia: Secondary | ICD-10-CM | POA: Diagnosis not present

## 2021-01-22 DIAGNOSIS — R051 Acute cough: Secondary | ICD-10-CM

## 2021-01-22 DIAGNOSIS — R0602 Shortness of breath: Secondary | ICD-10-CM

## 2021-01-23 DIAGNOSIS — Z03818 Encounter for observation for suspected exposure to other biological agents ruled out: Secondary | ICD-10-CM | POA: Diagnosis not present

## 2021-01-23 DIAGNOSIS — R0602 Shortness of breath: Secondary | ICD-10-CM | POA: Diagnosis not present

## 2021-01-23 DIAGNOSIS — R051 Acute cough: Secondary | ICD-10-CM | POA: Diagnosis not present

## 2021-01-26 DIAGNOSIS — J069 Acute upper respiratory infection, unspecified: Secondary | ICD-10-CM | POA: Diagnosis not present

## 2021-09-25 IMAGING — MR MR HEAD WO/W CM
13 of 15 series · 36 of 48 positions shown · IV contrast (gadavist)
Comparison: None.

CLINICAL DATA: Weakness and dizziness

EXAM:
MRI HEAD WITHOUT AND WITH CONTRAST
TECHNIQUE: Multiplanar, multiecho pulse sequences of the brain and surrounding
structures were obtained without and with intravenous contrast.
CONTRAST:  10mL GADAVIST GADOBUTROL 1 MMOL/ML IV SOLN

[Series 5: DWI · axial · 4.0mm · 0.88mm/px · z∈[-13,+124]mm · 4 of 36 slices shown (1 of 6)]
[im 1/36]
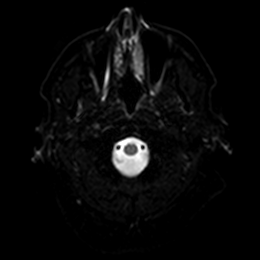
[im 12/36]
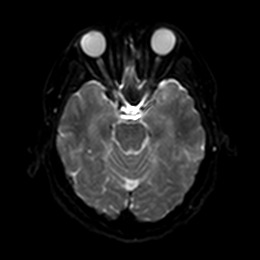
[im 24/36]
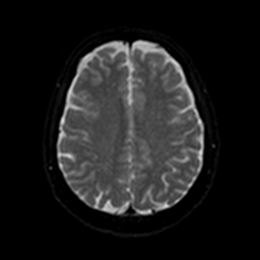
[im 36/36]
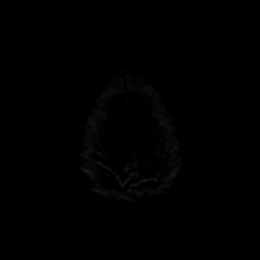

[Series 5: DWI · axial · 4.0mm · 0.88mm/px · z∈[-13,+124]mm · 3 of 36 slices shown (2 of 6)]
[im 1/36]
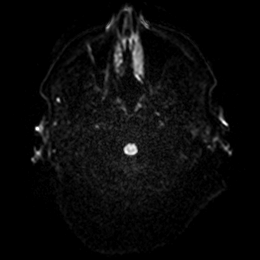
[im 18/36]
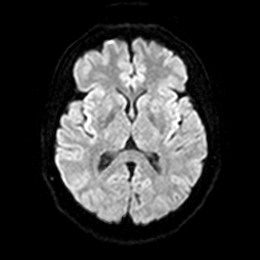
[im 36/36]
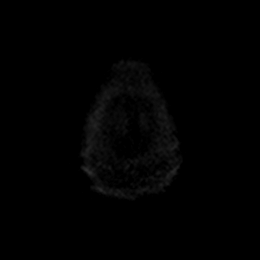

[Series 6: DWI · axial · 4.0mm · 0.88mm/px · z∈[-13,+124]mm · 3 of 36 slices shown (3 of 6)]
[im 1/36]
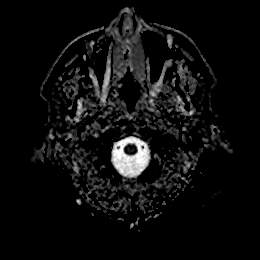
[im 18/36]
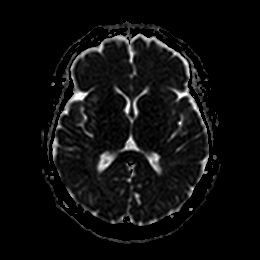
[im 36/36]
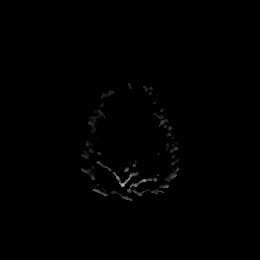

[Series 7: DWI · coronal · 4.0mm · 0.88mm/px · 3 of 32 slices shown (4 of 6)]
[im 1/32]
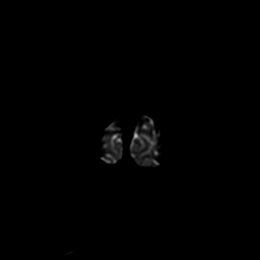
[im 16/32]
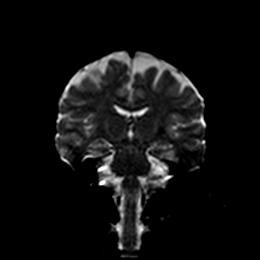
[im 32/32]
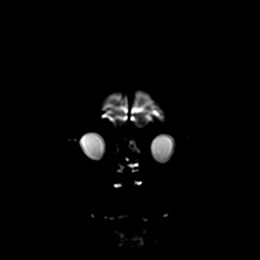

[Series 7: DWI · coronal · 4.0mm · 0.88mm/px · 3 of 32 slices shown (5 of 6)]
[im 1/32]
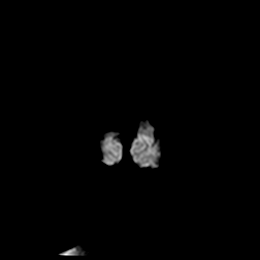
[im 16/32]
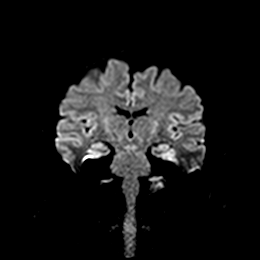
[im 32/32]
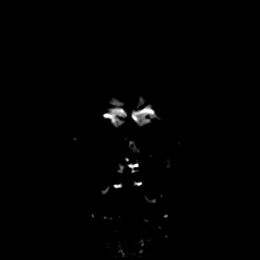

[Series 8: DWI · coronal · 4.0mm · 0.88mm/px · 3 of 32 slices shown (6 of 6)]
[im 1/32]
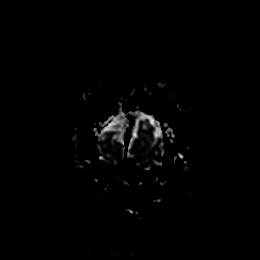
[im 16/32]
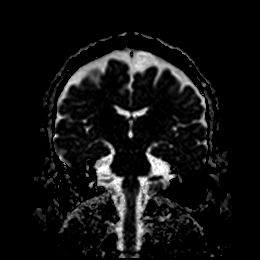
[im 32/32]
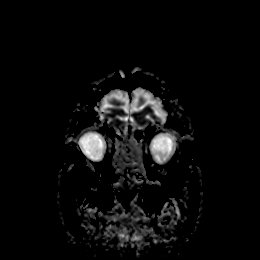

[Series 9: T1 · sagittal · 5.0mm · 0.94mm/px · 2 of 25 slices shown (1 of 2)]
[im 1/25]
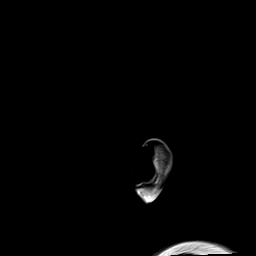
[im 25/25]
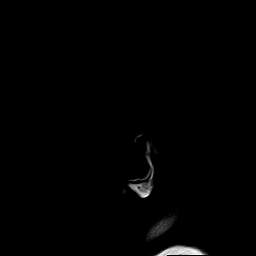

[Series 10: T2 · axial · 5.0mm · 0.72mm/px · z∈[-10,+120]mm · 2 of 20 slices shown (1 of 2)]
[im 1/20]
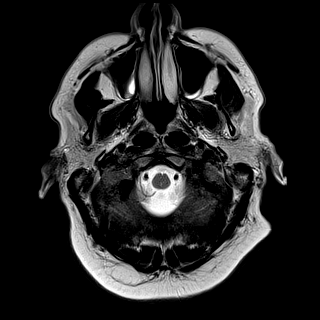
[im 20/20]
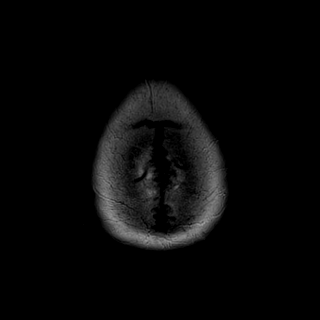

[Series 11: ax hemo · axial · 5.0mm · 0.86mm/px · z∈[-16,+125]mm · 2 of 25 slices shown]
[im 1/25]
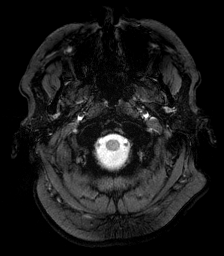
[im 25/25]
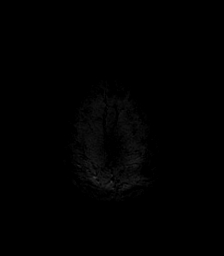

[Series 12: FLAIR · axial · 4.0mm · 0.43mm/px · z∈[-6,+115]mm · 3 of 32 slices shown]
[im 1/32]
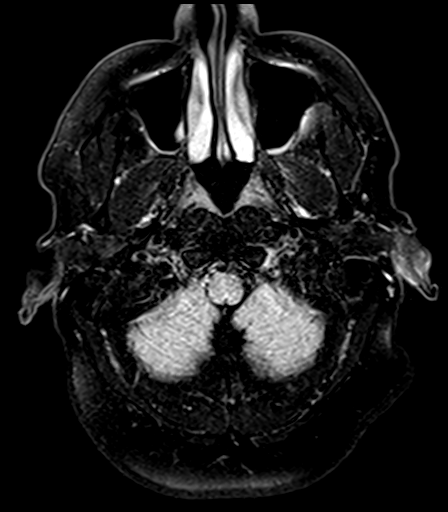
[im 16/32]
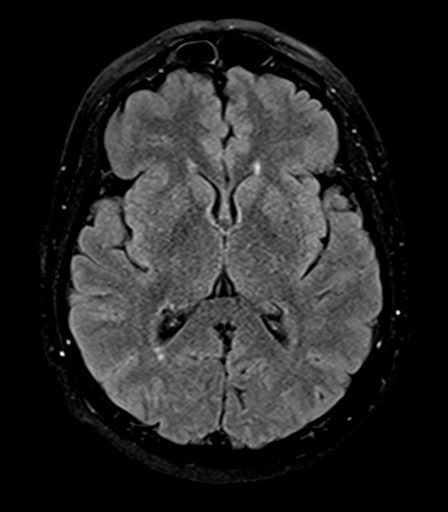
[im 32/32]
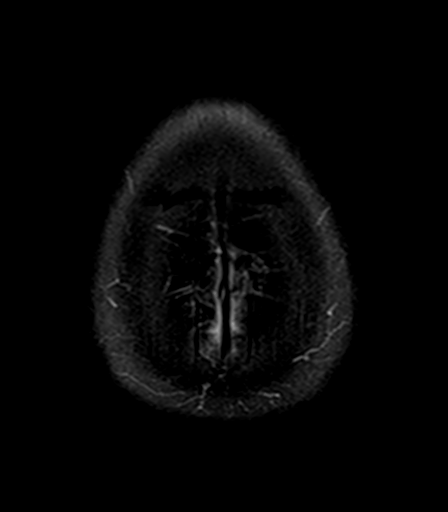

[Series 14: T2 · coronal · 5.0mm · 0.72mm/px · 3 of 28 slices shown (2 of 2)]
[im 1/28]
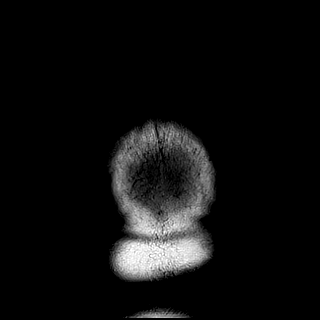
[im 14/28]
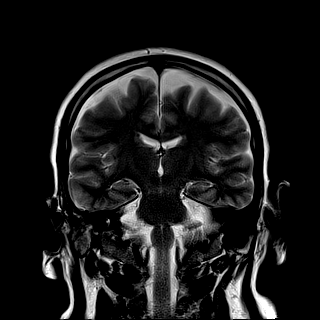
[im 28/28]
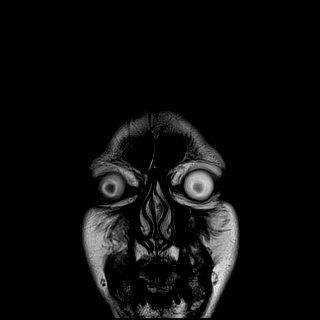

[Series 16: T1 post-contrast · coronal · 5.0mm · 0.34mm/px · 3 of 29 slices shown]
[im 1/29]
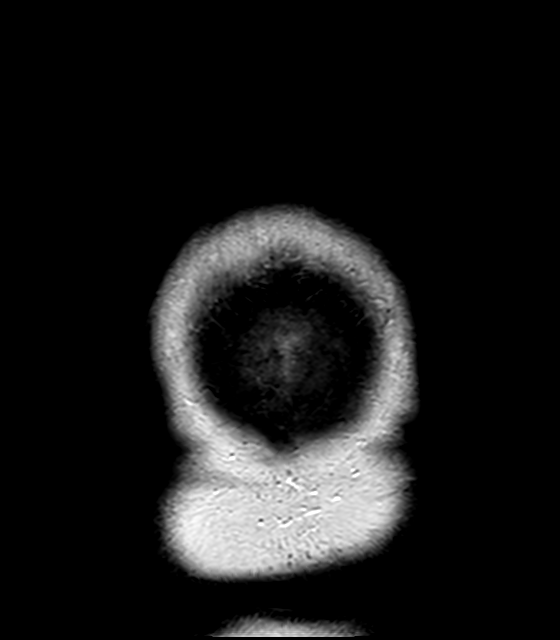
[im 15/29]
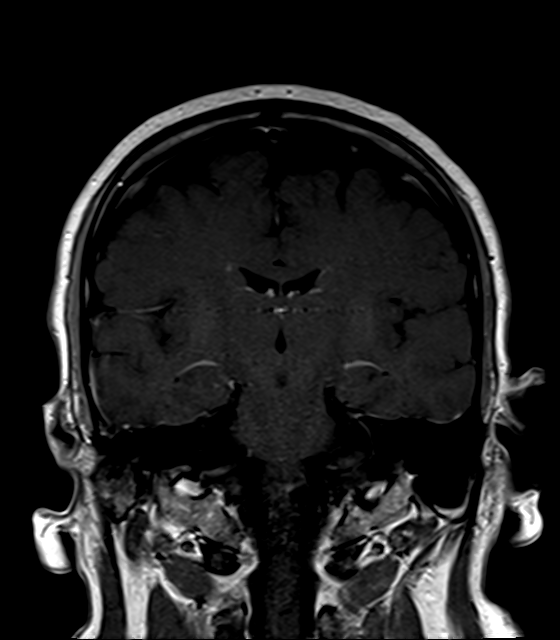
[im 29/29]
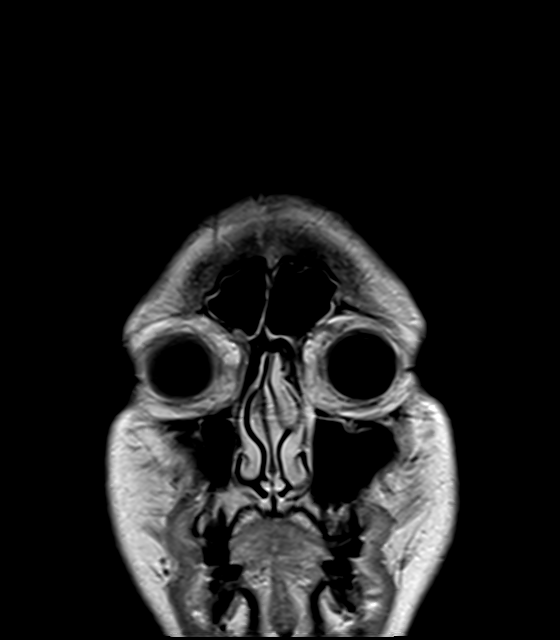

[Series 17: T1 · sagittal · 5.0mm · 0.94mm/px · 2 of 25 slices shown (2 of 2)]
[im 1/25]
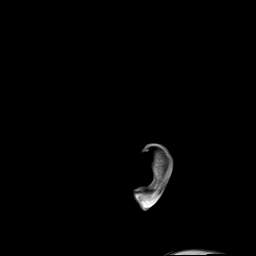
[im 25/25]
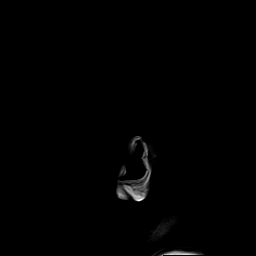

[36 of 48 positions shown; findings below may reference images not displayed]

FINDINGS: Brain: There is no acute infarction or intracranial hemorrhage.
There is no intracranial mass, mass effect, or edema. There is no
hydrocephalus or extra-axial fluid collection. Ventricles and sulci
are normal in size and configuration. Minimal small foci of T2
hyperintensity in the supratentorial white matter are nonspecific
but could reflect minor chronic microvascular ischemic changes. No
abnormal enhancement.

Vascular: Major vessel flow voids at the skull base are preserved.

Skull and upper cervical spine: Normal marrow signal is preserved.

Sinuses/Orbits: Paranasal sinuses are aerated. Orbits are
unremarkable.

Other: Incidental note is made of a partially empty sella. Mastoid
air cells are clear.
IMPRESSION: No evidence of recent infarction, hemorrhage, or mass. No abnormal
enhancement.

## 2022-12-06 ENCOUNTER — Ambulatory Visit (INDEPENDENT_AMBULATORY_CARE_PROVIDER_SITE_OTHER): Payer: Self-pay | Admitting: Adult Health

## 2022-12-06 ENCOUNTER — Encounter: Payer: Self-pay | Admitting: Adult Health

## 2022-12-06 VITALS — BP 136/84 | HR 77 | Ht 69.0 in | Wt 227.0 lb

## 2022-12-06 DIAGNOSIS — F331 Major depressive disorder, recurrent, moderate: Secondary | ICD-10-CM

## 2022-12-06 DIAGNOSIS — F411 Generalized anxiety disorder: Secondary | ICD-10-CM

## 2022-12-06 DIAGNOSIS — N62 Hypertrophy of breast: Secondary | ICD-10-CM

## 2022-12-06 NOTE — Progress Notes (Signed)
[facility] MD/PA/NP Initial Note  12/06/2022 2:53 PM Steven Conway  MRN:  [id number]  Chief Complaint:   HPI:  Patient seen today for initial psychiatric evaluation.   Referred by PCP for MDD - Moshe Cipro.  Wife on call for brief part of interview.   Describes mood today as "ok". Pleasant. Denies tearfulness. Mood symptoms - denies depression, anxiety, and irritability. Denies panic attacks. Denies worry, rumination, and over thinking. Reports mood has improved over past few weeks. Stating "I feel better emotionally". Reports taking Celxa 40mg  and Wellbutrin SR 100mg  daily. PCP recently added Vraylar 1.5mg  daily and he feels like his depression is better. Does report some compliance issues wife agreeing to put medications in pill box for him. Reports he is also concerned about his  Gynecomastia. Reports childhood truama and PTSD related symptoms. Reports he has struggled with it all his life and would like to see an endocrinologist to discuss options. He is willing to consider therapy as an option, but feels like his current struggles are more physical. Feels like he has worked through past trauma, but would like to see physical changes to his body. Stable interest and motivation. Taking medications as prescribed.  Energy levels Active, does not have a regular exercise routine. Works full-time  Enjoys some usual interests and activities. Spending time with family. Appetite adequate. Weight stable. Sleeps well most nights.   Focus and concentration stable. Completing tasks. Managing aspects of household. Works full time.  Denies SI or HI.  Denies AH or VH. Denies self harm. Denies substance use.  Previous medication trials: Denies  Visit Diagnosis:    ICD-10-CM   1. Major depressive disorder, recurrent episode, moderate (HCC)  F33.1     2. Generalized anxiety disorder  F41.1     3. Gynecomastia, male  N29       Past Psychiatric History: Denies psychiatric  hospitalization.   Past Medical History:  Past Medical History:  Diagnosis Date   Cancer (HCC)    Leukemia (HCC)    Stroke [facility])     Past Surgical History:  Procedure Laterality Date   APPENDECTOMY      Family Psychiatric History: Reports family history of mental illness.   Family History: No family history on file.  Social History:  Social History   Socioeconomic History   Marital status: Married    Spouse name: Not on file   Number of children: Not on file   Years of education: Not on file   Highest education level: Not on file  Occupational History   Not on file  Tobacco Use   Smoking status: Never   Smokeless tobacco: Never  Vaping Use   Vaping status: Never Used  Substance and Sexual Activity   Alcohol use: Yes    Comment: rarely   Drug use: Never   Sexual activity: Not on file  Other Topics Concern   Not on file  Social History Narrative   Not on file   Social Determinants of Health   Financial Resource Strain: Not on file  Food Insecurity: Not on file  Transportation Needs: Not on file  Physical Activity: Not on file  Stress: Not on file  Social Connections: Not on file    Allergies: No Known Allergies  Metabolic Disorder Labs: Lab Results  Component Value Date   HGBA1C 6.0 (H) 11/10/2019   MPG 125.5 11/10/2019   No results found for: "PROLACTIN" Lab Results  Component Value Date   CHOL 196 11/11/2019   TRIG  146 11/11/2019   HDL 40 (L) 11/11/2019   CHOLHDL 4.9 11/11/2019   VLDL 29 11/11/2019   LDLCALC 127 (H) 11/11/2019   No results found for: "TSH"  Therapeutic Level Labs: No results found for: "LITHIUM" No results found for: "VALPROATE" No results found for: "CBMZ"  Current Medications: Current Outpatient Medications  Medication Sig Dispense Refill   buPROPion (WELLBUTRIN SR) 100 MG 12 hr tablet Take 100 mg by mouth every morning.     chlorthalidone (HYGROTON) 25 MG tablet Take 25 mg by mouth daily.     citalopram (CELEXA)  40 MG tablet Take 40 mg by mouth daily.     No current facility-administered medications for this visit.    Medication Side Effects: none  Orders placed this visit:  No orders of the defined types were placed in this encounter.   Psychiatric Specialty Exam:  Review of Systems  Musculoskeletal:  Negative for gait problem.  Neurological:  Negative for tremors.  Psychiatric/Behavioral:         Please refer to HPI    There were no vitals taken for this visit.There is no height or weight on file to calculate BMI.  General Appearance: Neat and Well Groomed  Eye Contact:  Good  Speech:  Clear and Coherent and Normal Rate  Volume:  Normal  Mood:  Euthymic  Affect:  Appropriate and Congruent  Thought Process:  Coherent and Descriptions of Associations: Intact  Orientation:  Full (Time, Place, and Person)  Thought Content: Logical   Suicidal Thoughts:  No  Homicidal Thoughts:  No  Memory:  WNL  Judgement:  Good  Insight:  Good  Psychomotor Activity:  Normal  Concentration:  Concentration: Good and Attention Span: Good  Recall:  Good  Fund of Knowledge: Good  Language: Good  Assets:  Communication Skills Desire for Improvement Financial Resources/Insurance Housing Intimacy Leisure Time Physical Health Resilience Social Support Talents/Skills Transportation Vocational/Educational  ADL's:  Intact  Cognition: WNL  Prognosis:  Good   Screenings:   Receiving Psychotherapy: No   Treatment Plan/Recommendations:   Plan:  PDMP reviewed  Vraylar 1.5mg  daily - started 2 weeks ago - helpful but not taking consistently.  Wellbutrin SR 100mg  every morning  Celexa 78m daily  Will need a referral to Endocrinology   RTC 4 weeks  Patient advised to contact office with any questions, adverse effects, or acute worsening in signs and symptoms.  Discussed potential metabolic side effects associated with atypical antipsychotics, as well as potential risk for movement side  effects. Advised pt to contact office if movement side effects occur.        Dorothyann Gibbs, NP
